# Patient Record
Sex: Male | Born: 1950
Health system: Southern US, Community
[De-identification: ages and names within clinical notes are randomized; demographics above are authoritative.]

## PROBLEM LIST (undated history)

## (undated) DIAGNOSIS — L4050 Arthropathic psoriasis, unspecified: Secondary | ICD-10-CM

## (undated) DIAGNOSIS — G4730 Sleep apnea, unspecified: Secondary | ICD-10-CM

## (undated) DIAGNOSIS — E349 Endocrine disorder, unspecified: Secondary | ICD-10-CM

## (undated) DIAGNOSIS — I10 Essential (primary) hypertension: Secondary | ICD-10-CM

## (undated) DIAGNOSIS — E785 Hyperlipidemia, unspecified: Secondary | ICD-10-CM

## (undated) DIAGNOSIS — N200 Calculus of kidney: Secondary | ICD-10-CM

## (undated) DIAGNOSIS — C4490 Unspecified malignant neoplasm of skin, unspecified: Secondary | ICD-10-CM

## (undated) DIAGNOSIS — Z9889 Other specified postprocedural states: Secondary | ICD-10-CM

## (undated) HISTORY — DX: Sleep apnea, unspecified: G47.30

## (undated) HISTORY — DX: Endocrine disorder, unspecified: E34.9

## (undated) HISTORY — DX: Essential (primary) hypertension: I10

## (undated) HISTORY — DX: Arthropathic psoriasis, unspecified: L40.50

## (undated) HISTORY — DX: Hyperlipidemia, unspecified: E78.5

## (undated) HISTORY — DX: Other specified postprocedural states: Z98.890

## (undated) HISTORY — DX: Unspecified malignant neoplasm of skin, unspecified: C44.90

## (undated) HISTORY — DX: Calculus of kidney: N20.0

---
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
## 1991-04-13 HISTORY — PX: APPENDECTOMY: SHX54

## 2003-03-05 ENCOUNTER — Ambulatory Visit (HOSPITAL_COMMUNITY): Admission: RE | Admit: 2003-03-05 | Discharge: 2003-03-05 | Payer: Self-pay | Admitting: Family Medicine

## 2003-06-20 ENCOUNTER — Emergency Department (HOSPITAL_COMMUNITY): Admission: EM | Admit: 2003-06-20 | Discharge: 2003-06-20 | Payer: Self-pay | Admitting: Emergency Medicine

## 2003-06-20 ENCOUNTER — Emergency Department (HOSPITAL_COMMUNITY): Admission: AD | Admit: 2003-06-20 | Discharge: 2003-06-20 | Payer: Self-pay | Admitting: Family Medicine

## 2004-12-04 ENCOUNTER — Inpatient Hospital Stay (HOSPITAL_COMMUNITY): Admission: EM | Admit: 2004-12-04 | Discharge: 2004-12-04 | Payer: Self-pay | Admitting: Emergency Medicine

## 2004-12-10 ENCOUNTER — Encounter: Admission: RE | Admit: 2004-12-10 | Discharge: 2004-12-10 | Payer: Self-pay

## 2005-07-28 ENCOUNTER — Ambulatory Visit: Payer: Self-pay | Admitting: Internal Medicine

## 2005-08-11 ENCOUNTER — Ambulatory Visit (HOSPITAL_BASED_OUTPATIENT_CLINIC_OR_DEPARTMENT_OTHER): Admission: RE | Admit: 2005-08-11 | Discharge: 2005-08-11 | Payer: Self-pay | Admitting: Internal Medicine

## 2005-08-15 ENCOUNTER — Ambulatory Visit: Payer: Self-pay | Admitting: Internal Medicine

## 2005-08-26 ENCOUNTER — Ambulatory Visit: Payer: Self-pay | Admitting: Internal Medicine

## 2006-04-12 HISTORY — PX: SHOULDER SURGERY: SHX246

## 2006-09-02 ENCOUNTER — Encounter: Payer: Self-pay | Admitting: Internal Medicine

## 2006-10-13 ENCOUNTER — Encounter: Payer: Self-pay | Admitting: Internal Medicine

## 2006-10-18 ENCOUNTER — Encounter: Payer: Self-pay | Admitting: Internal Medicine

## 2006-12-08 ENCOUNTER — Ambulatory Visit: Payer: Self-pay | Admitting: Internal Medicine

## 2006-12-08 DIAGNOSIS — L408 Other psoriasis: Secondary | ICD-10-CM

## 2006-12-08 DIAGNOSIS — E785 Hyperlipidemia, unspecified: Secondary | ICD-10-CM | POA: Insufficient documentation

## 2006-12-08 DIAGNOSIS — E291 Testicular hypofunction: Secondary | ICD-10-CM

## 2006-12-08 DIAGNOSIS — Z85828 Personal history of other malignant neoplasm of skin: Secondary | ICD-10-CM

## 2006-12-08 DIAGNOSIS — E119 Type 2 diabetes mellitus without complications: Secondary | ICD-10-CM

## 2006-12-08 LAB — CONVERTED CEMR LAB: Anti Nuclear Antibody(ANA): NEGATIVE

## 2006-12-13 LAB — CONVERTED CEMR LAB
ALT: 37 units/L (ref 0–53)
AST: 21 units/L (ref 0–37)
Albumin: 4.1 g/dL (ref 3.5–5.2)
Alkaline Phosphatase: 51 units/L (ref 39–117)
Bilirubin, Direct: 0.1 mg/dL (ref 0.0–0.3)
CRP, High Sensitivity: 6 — ABNORMAL HIGH (ref 0.00–5.00)
Folate: 3.5 ng/mL
Rheumatoid fact SerPl-aCnc: <20 intl units/mL — ABNORMAL LOW (ref 0.0–20.0)
Sed Rate: 9 mm/hr (ref 0–20)
TSH: 2.18 microintl units/mL (ref 0.35–5.50)
Total Bilirubin: 1.2 mg/dL (ref 0.3–1.2)
Total CK: 89 units/L (ref 7–195)
Total Protein: 8.2 g/dL (ref 6.0–8.3)
Vitamin B-12: 374 pg/mL (ref 211–911)

## 2006-12-19 ENCOUNTER — Telehealth: Payer: Self-pay | Admitting: Internal Medicine

## 2007-01-09 ENCOUNTER — Encounter: Payer: Self-pay | Admitting: Internal Medicine

## 2007-01-12 ENCOUNTER — Encounter: Payer: Self-pay | Admitting: Internal Medicine

## 2007-01-19 ENCOUNTER — Encounter: Payer: Self-pay | Admitting: Internal Medicine

## 2007-01-30 ENCOUNTER — Encounter: Payer: Self-pay | Admitting: Internal Medicine

## 2007-02-08 ENCOUNTER — Telehealth (INDEPENDENT_AMBULATORY_CARE_PROVIDER_SITE_OTHER): Payer: Self-pay | Admitting: *Deleted

## 2007-02-16 ENCOUNTER — Ambulatory Visit: Payer: Self-pay | Admitting: Internal Medicine

## 2007-03-02 ENCOUNTER — Encounter: Payer: Self-pay | Admitting: Internal Medicine

## 2007-03-10 ENCOUNTER — Encounter: Payer: Self-pay | Admitting: Internal Medicine

## 2007-05-22 ENCOUNTER — Ambulatory Visit: Payer: Self-pay | Admitting: Internal Medicine

## 2007-06-01 ENCOUNTER — Ambulatory Visit: Payer: Self-pay | Admitting: Internal Medicine

## 2007-06-01 LAB — CONVERTED CEMR LAB
Glucose, Urine, Semiquant: >=1000
Ketones, urine, test strip: NEGATIVE
Nitrite: NEGATIVE
Protein, U semiquant: 30
Specific Gravity, Urine: >=1.03
Urobilinogen, UA: NEGATIVE
WBC Urine, dipstick: NEGATIVE
pH: 5

## 2007-06-02 ENCOUNTER — Encounter: Payer: Self-pay | Admitting: Internal Medicine

## 2007-06-02 LAB — CONVERTED CEMR LAB
Bacteria, UA: NONE SEEN
RBC / HPF: NONE SEEN (ref <3)
WBC, UA: NONE SEEN cells/hpf (ref <3)

## 2007-06-07 ENCOUNTER — Telehealth: Payer: Self-pay | Admitting: Internal Medicine

## 2007-06-07 LAB — CONVERTED CEMR LAB
ALT: 34 units/L (ref 0–53)
AST: 26 units/L (ref 0–37)
BUN: 7 mg/dL (ref 6–23)
Basophils Absolute: 0 10*3/uL (ref 0.0–0.1)
Basophils Relative: 0.8 % (ref 0.0–1.0)
CO2: 24 meq/L (ref 19–32)
Calcium: 9.1 mg/dL (ref 8.4–10.5)
Chloride: 98 meq/L (ref 96–112)
Cholesterol: 220 mg/dL (ref 0–200)
Creatinine, Ser: 1.1 mg/dL (ref 0.4–1.5)
Creatinine,U: 368.3 mg/dL
Direct LDL: 143.4 mg/dL
Eosinophils Absolute: 0.1 10*3/uL (ref 0.0–0.6)
Eosinophils Relative: 1.6 % (ref 0.0–5.0)
FSH: 0.5 milliintl units/mL
GFR calc Af Amer: 89 mL/min
GFR calc non Af Amer: 74 mL/min
Glucose, Bld: 243 mg/dL — ABNORMAL HIGH (ref 70–99)
HCT: 56.1 % — ABNORMAL HIGH (ref 39.0–52.0)
HDL: 32.3 mg/dL — ABNORMAL LOW (ref >39.0)
Hemoglobin: 18.4 g/dL — ABNORMAL HIGH (ref 13.0–17.0)
Hgb A1c MFr Bld: 9.9 % — ABNORMAL HIGH (ref 4.6–6.0)
LH: 0.6 milliintl units/mL
Lymphocytes Relative: 33.2 % (ref 12.0–46.0)
MCHC: 32.8 g/dL (ref 30.0–36.0)
MCV: 94 fL (ref 78.0–100.0)
Microalb Creat Ratio: 29.9 mg/g (ref 0.0–30.0)
Microalb, Ur: 11 mg/dL — ABNORMAL HIGH (ref 0.0–1.9)
Monocytes Absolute: 0.5 10*3/uL (ref 0.2–0.7)
Monocytes Relative: 9.2 % (ref 3.0–11.0)
Neutro Abs: 3.2 10*3/uL (ref 1.4–7.7)
Neutrophils Relative %: 55.2 % (ref 43.0–77.0)
PSA: 0.68 ng/mL (ref 0.10–4.00)
Platelets: 224 10*3/uL (ref 150–400)
Potassium: 3.9 meq/L (ref 3.5–5.1)
RBC: 5.97 M/uL — ABNORMAL HIGH (ref 4.22–5.81)
RDW: 13.1 % (ref 11.5–14.6)
Sodium: 134 meq/L — ABNORMAL LOW (ref 135–145)
Testosterone: 724.82 ng/dL (ref 350.00–890)
Total CHOL/HDL Ratio: 6.8
Triglycerides: 237 mg/dL (ref 0–149)
VLDL: 47 mg/dL — ABNORMAL HIGH (ref 0–40)
WBC: 5.7 10*3/uL (ref 4.5–10.5)

## 2007-06-14 ENCOUNTER — Encounter (INDEPENDENT_AMBULATORY_CARE_PROVIDER_SITE_OTHER): Payer: Self-pay | Admitting: *Deleted

## 2007-06-14 ENCOUNTER — Telehealth (INDEPENDENT_AMBULATORY_CARE_PROVIDER_SITE_OTHER): Payer: Self-pay | Admitting: *Deleted

## 2007-06-22 ENCOUNTER — Ambulatory Visit: Payer: Self-pay | Admitting: Endocrinology

## 2007-06-22 DIAGNOSIS — G4730 Sleep apnea, unspecified: Secondary | ICD-10-CM | POA: Insufficient documentation

## 2007-06-23 ENCOUNTER — Encounter: Payer: Self-pay | Admitting: Internal Medicine

## 2007-06-23 ENCOUNTER — Telehealth (INDEPENDENT_AMBULATORY_CARE_PROVIDER_SITE_OTHER): Payer: Self-pay | Admitting: *Deleted

## 2007-07-06 ENCOUNTER — Ambulatory Visit: Payer: Self-pay | Admitting: Internal Medicine

## 2007-07-07 ENCOUNTER — Encounter: Payer: Self-pay | Admitting: Internal Medicine

## 2007-07-07 LAB — CONVERTED CEMR LAB
Bacteria, UA: NONE SEEN
RBC / HPF: NONE SEEN (ref <3)

## 2007-07-14 ENCOUNTER — Telehealth: Payer: Self-pay | Admitting: Internal Medicine

## 2007-07-14 DIAGNOSIS — R5381 Other malaise: Secondary | ICD-10-CM

## 2007-07-14 DIAGNOSIS — R5383 Other fatigue: Secondary | ICD-10-CM

## 2007-07-20 ENCOUNTER — Ambulatory Visit: Payer: Self-pay | Admitting: Internal Medicine

## 2007-07-24 ENCOUNTER — Encounter: Admission: RE | Admit: 2007-07-24 | Discharge: 2007-07-24 | Payer: Self-pay | Admitting: Internal Medicine

## 2007-07-24 ENCOUNTER — Encounter: Payer: Self-pay | Admitting: Internal Medicine

## 2007-07-27 ENCOUNTER — Ambulatory Visit: Payer: Self-pay | Admitting: Internal Medicine

## 2007-07-27 DIAGNOSIS — D518 Other vitamin B12 deficiency anemias: Secondary | ICD-10-CM

## 2007-08-03 ENCOUNTER — Ambulatory Visit: Payer: Self-pay | Admitting: Internal Medicine

## 2007-08-10 ENCOUNTER — Ambulatory Visit: Payer: Self-pay | Admitting: Internal Medicine

## 2007-08-10 ENCOUNTER — Encounter: Payer: Self-pay | Admitting: Endocrinology

## 2007-08-10 LAB — CONVERTED CEMR LAB: Estradiol: 6.9 pg/mL

## 2007-08-11 ENCOUNTER — Encounter: Admission: RE | Admit: 2007-08-11 | Discharge: 2007-08-11 | Payer: Self-pay | Admitting: Rheumatology

## 2007-08-11 ENCOUNTER — Encounter: Payer: Self-pay | Admitting: Internal Medicine

## 2007-08-12 LAB — CONVERTED CEMR LAB
LH: 2.8 milliintl units/mL
Prolactin: 4.7 ng/mL
TSH: 1.46 microintl units/mL (ref 0.35–5.50)
Testosterone: 168.42 ng/dL — ABNORMAL LOW (ref 350.00–890)
Vitamin B-12: 993 pg/mL — ABNORMAL HIGH (ref 211–911)

## 2007-09-13 ENCOUNTER — Telehealth (INDEPENDENT_AMBULATORY_CARE_PROVIDER_SITE_OTHER): Payer: Self-pay | Admitting: *Deleted

## 2007-09-14 ENCOUNTER — Ambulatory Visit: Payer: Self-pay | Admitting: Internal Medicine

## 2007-09-14 LAB — CONVERTED CEMR LAB: Blood Glucose, Fasting: 173 mg/dL

## 2007-09-18 ENCOUNTER — Encounter (INDEPENDENT_AMBULATORY_CARE_PROVIDER_SITE_OTHER): Payer: Self-pay | Admitting: *Deleted

## 2007-09-18 LAB — CONVERTED CEMR LAB
ALT: 34 units/L (ref 0–53)
AST: 27 units/L (ref 0–37)
Cholesterol: 197 mg/dL (ref 0–200)
HDL: 43.6 mg/dL (ref >39.0)
Hgb A1c MFr Bld: 10.3 % — ABNORMAL HIGH (ref 4.6–6.0)
LDL Cholesterol: 124 mg/dL — ABNORMAL HIGH (ref 0–99)
Total CHOL/HDL Ratio: 4.5
Triglycerides: 146 mg/dL (ref 0–149)
VLDL: 29 mg/dL (ref 0–40)

## 2007-09-29 ENCOUNTER — Telehealth: Payer: Self-pay | Admitting: Internal Medicine

## 2007-10-09 ENCOUNTER — Encounter: Payer: Self-pay | Admitting: Internal Medicine

## 2007-10-12 ENCOUNTER — Ambulatory Visit: Payer: Self-pay | Admitting: Internal Medicine

## 2007-11-13 ENCOUNTER — Ambulatory Visit: Payer: Self-pay | Admitting: Internal Medicine

## 2007-12-14 ENCOUNTER — Ambulatory Visit: Payer: Self-pay | Admitting: Internal Medicine

## 2007-12-28 ENCOUNTER — Ambulatory Visit: Payer: Self-pay | Admitting: Internal Medicine

## 2007-12-28 DIAGNOSIS — I10 Essential (primary) hypertension: Secondary | ICD-10-CM

## 2007-12-29 ENCOUNTER — Encounter (INDEPENDENT_AMBULATORY_CARE_PROVIDER_SITE_OTHER): Payer: Self-pay | Admitting: *Deleted

## 2007-12-29 LAB — CONVERTED CEMR LAB
BUN: 16 mg/dL (ref 6–23)
CO2: 24 meq/L (ref 19–32)
Calcium: 9.5 mg/dL (ref 8.4–10.5)
Chloride: 102 meq/L (ref 96–112)
Creatinine, Ser: 0.8 mg/dL (ref 0.4–1.5)
GFR calc Af Amer: 128 mL/min
GFR calc non Af Amer: 106 mL/min
Glucose, Bld: 104 mg/dL — ABNORMAL HIGH (ref 70–99)
Hgb A1c MFr Bld: 6.2 % — ABNORMAL HIGH (ref 4.6–6.0)
Potassium: 4 meq/L (ref 3.5–5.1)
Sed Rate: 14 mm/hr (ref 0–16)
Sodium: 139 meq/L (ref 135–145)

## 2008-01-04 ENCOUNTER — Encounter: Payer: Self-pay | Admitting: Internal Medicine

## 2008-01-30 ENCOUNTER — Encounter: Payer: Self-pay | Admitting: Internal Medicine

## 2008-03-13 ENCOUNTER — Encounter: Payer: Self-pay | Admitting: Internal Medicine

## 2008-03-19 ENCOUNTER — Encounter: Payer: Self-pay | Admitting: Internal Medicine

## 2008-04-02 ENCOUNTER — Ambulatory Visit: Payer: Self-pay | Admitting: Internal Medicine

## 2008-04-02 DIAGNOSIS — N209 Urinary calculus, unspecified: Secondary | ICD-10-CM | POA: Insufficient documentation

## 2008-04-02 LAB — CONVERTED CEMR LAB
Bilirubin Urine: NEGATIVE
Glucose, Urine, Semiquant: NEGATIVE
Ketones, urine, test strip: NEGATIVE
Nitrite: NEGATIVE
Protein, U semiquant: NEGATIVE
Specific Gravity, Urine: >=1.03
Urobilinogen, UA: 0.2
WBC Urine, dipstick: NEGATIVE
pH: 5

## 2008-04-03 ENCOUNTER — Encounter: Payer: Self-pay | Admitting: Internal Medicine

## 2008-04-09 ENCOUNTER — Ambulatory Visit: Payer: Self-pay | Admitting: Cardiovascular Disease

## 2008-04-09 ENCOUNTER — Telehealth: Payer: Self-pay | Admitting: Internal Medicine

## 2008-04-09 ENCOUNTER — Telehealth (INDEPENDENT_AMBULATORY_CARE_PROVIDER_SITE_OTHER): Payer: Self-pay | Admitting: *Deleted

## 2008-04-09 ENCOUNTER — Ambulatory Visit: Payer: Self-pay | Admitting: Internal Medicine

## 2008-04-09 LAB — CONVERTED CEMR LAB
BUN: 17 mg/dL (ref 6–23)
Creatinine, Ser: 1.1 mg/dL (ref 0.4–1.5)

## 2008-04-10 ENCOUNTER — Telehealth: Payer: Self-pay | Admitting: Internal Medicine

## 2008-04-10 ENCOUNTER — Ambulatory Visit: Payer: Self-pay | Admitting: Internal Medicine

## 2008-04-11 ENCOUNTER — Encounter: Payer: Self-pay | Admitting: Internal Medicine

## 2008-04-18 ENCOUNTER — Ambulatory Visit: Payer: Self-pay | Admitting: Internal Medicine

## 2008-04-22 ENCOUNTER — Telehealth (INDEPENDENT_AMBULATORY_CARE_PROVIDER_SITE_OTHER): Payer: Self-pay | Admitting: *Deleted

## 2008-04-22 LAB — CONVERTED CEMR LAB
BUN: 9 mg/dL (ref 6–23)
CO2: 24 meq/L (ref 19–32)
Calcium: 10 mg/dL (ref 8.4–10.5)
Chloride: 100 meq/L (ref 96–112)
Creatinine, Ser: 0.7 mg/dL (ref 0.4–1.5)
GFR calc Af Amer: 149 mL/min
GFR calc non Af Amer: 124 mL/min
Glucose, Bld: 184 mg/dL — ABNORMAL HIGH (ref 70–99)
Potassium: 4 meq/L (ref 3.5–5.1)
Sodium: 137 meq/L (ref 135–145)

## 2008-05-01 ENCOUNTER — Telehealth: Payer: Self-pay | Admitting: Internal Medicine

## 2008-05-03 ENCOUNTER — Ambulatory Visit: Payer: Self-pay | Admitting: Internal Medicine

## 2008-05-06 ENCOUNTER — Telehealth (INDEPENDENT_AMBULATORY_CARE_PROVIDER_SITE_OTHER): Payer: Self-pay | Admitting: *Deleted

## 2008-05-06 LAB — CONVERTED CEMR LAB
BUN: 11 mg/dL (ref 6–23)
Creatinine, Ser: 0.9 mg/dL (ref 0.4–1.5)

## 2008-10-15 ENCOUNTER — Telehealth (INDEPENDENT_AMBULATORY_CARE_PROVIDER_SITE_OTHER): Payer: Self-pay | Admitting: *Deleted

## 2008-10-16 ENCOUNTER — Telehealth (INDEPENDENT_AMBULATORY_CARE_PROVIDER_SITE_OTHER): Payer: Self-pay | Admitting: *Deleted

## 2008-10-18 ENCOUNTER — Encounter: Payer: Self-pay | Admitting: Internal Medicine

## 2008-10-18 ENCOUNTER — Telehealth (INDEPENDENT_AMBULATORY_CARE_PROVIDER_SITE_OTHER): Payer: Self-pay | Admitting: *Deleted

## 2008-10-21 ENCOUNTER — Telehealth (INDEPENDENT_AMBULATORY_CARE_PROVIDER_SITE_OTHER): Payer: Self-pay | Admitting: *Deleted

## 2008-10-21 ENCOUNTER — Encounter: Payer: Self-pay | Admitting: Internal Medicine

## 2008-11-05 ENCOUNTER — Ambulatory Visit: Payer: Self-pay | Admitting: Internal Medicine

## 2008-11-12 LAB — CONVERTED CEMR LAB
ALT: 48 units/L (ref 0–53)
AST: 31 units/L (ref 0–37)
BUN: 17 mg/dL (ref 6–23)
Basophils Absolute: 0 10*3/uL (ref 0.0–0.1)
Basophils Relative: 0.6 % (ref 0.0–3.0)
CO2: 29 meq/L (ref 19–32)
Calcium: 9.3 mg/dL (ref 8.4–10.5)
Chloride: 99 meq/L (ref 96–112)
Cholesterol: 244 mg/dL — ABNORMAL HIGH (ref 0–200)
Creatinine, Ser: 0.9 mg/dL (ref 0.4–1.5)
Direct LDL: 134.5 mg/dL
Eosinophils Absolute: 0.1 10*3/uL (ref 0.0–0.7)
Eosinophils Relative: 2.2 % (ref 0.0–5.0)
GFR calc non Af Amer: 92.1 mL/min (ref >60)
Glucose, Bld: 269 mg/dL — ABNORMAL HIGH (ref 70–99)
HCT: 44 % (ref 39.0–52.0)
HDL: 42.5 mg/dL (ref >39.00)
Hemoglobin: 15.4 g/dL (ref 13.0–17.0)
Hgb A1c MFr Bld: 8.9 % — ABNORMAL HIGH (ref 4.6–6.5)
Lymphocytes Relative: 33.3 % (ref 12.0–46.0)
Lymphs Abs: 1.8 10*3/uL (ref 0.7–4.0)
MCHC: 35 g/dL (ref 30.0–36.0)
MCV: 93.3 fL (ref 78.0–100.0)
Monocytes Absolute: 0.5 10*3/uL (ref 0.1–1.0)
Monocytes Relative: 8.6 % (ref 3.0–12.0)
Neutro Abs: 2.9 10*3/uL (ref 1.4–7.7)
Neutrophils Relative %: 55.3 % (ref 43.0–77.0)
PSA: 0.4 ng/mL (ref 0.10–4.00)
Platelets: 205 10*3/uL (ref 150.0–400.0)
Potassium: 4.6 meq/L (ref 3.5–5.1)
RBC: 4.71 M/uL (ref 4.22–5.81)
RDW: 12.6 % (ref 11.5–14.6)
Sodium: 137 meq/L (ref 135–145)
TSH: 1.32 microintl units/mL (ref 0.35–5.50)
Total CHOL/HDL Ratio: 6
Triglycerides: 350 mg/dL — ABNORMAL HIGH (ref 0.0–149.0)
Uric Acid, Serum: 4.9 mg/dL (ref 4.0–7.8)
VLDL: 70 mg/dL — ABNORMAL HIGH (ref 0.0–40.0)
WBC: 5.3 10*3/uL (ref 4.5–10.5)

## 2008-11-13 ENCOUNTER — Encounter (INDEPENDENT_AMBULATORY_CARE_PROVIDER_SITE_OTHER): Payer: Self-pay | Admitting: *Deleted

## 2008-11-13 ENCOUNTER — Ambulatory Visit: Payer: Self-pay | Admitting: Internal Medicine

## 2008-12-09 ENCOUNTER — Ambulatory Visit: Payer: Self-pay | Admitting: Internal Medicine

## 2008-12-10 ENCOUNTER — Encounter: Payer: Self-pay | Admitting: Internal Medicine

## 2008-12-11 ENCOUNTER — Encounter (INDEPENDENT_AMBULATORY_CARE_PROVIDER_SITE_OTHER): Payer: Self-pay | Admitting: *Deleted

## 2008-12-11 LAB — CONVERTED CEMR LAB: Fecal Occult Bld: NEGATIVE

## 2008-12-13 ENCOUNTER — Encounter: Payer: Self-pay | Admitting: Internal Medicine

## 2008-12-17 ENCOUNTER — Encounter: Payer: Self-pay | Admitting: Internal Medicine

## 2008-12-19 ENCOUNTER — Telehealth: Payer: Self-pay | Admitting: Internal Medicine

## 2008-12-31 ENCOUNTER — Encounter: Admission: RE | Admit: 2008-12-31 | Discharge: 2008-12-31 | Payer: Self-pay | Admitting: Internal Medicine

## 2008-12-31 ENCOUNTER — Encounter: Payer: Self-pay | Admitting: Internal Medicine

## 2009-01-14 ENCOUNTER — Ambulatory Visit: Payer: Self-pay | Admitting: Internal Medicine

## 2009-01-16 LAB — CONVERTED CEMR LAB: Hgb A1c MFr Bld: 7.6 % — ABNORMAL HIGH (ref 4.6–6.5)

## 2009-03-12 ENCOUNTER — Encounter: Payer: Self-pay | Admitting: Internal Medicine

## 2009-04-14 ENCOUNTER — Ambulatory Visit: Payer: Self-pay | Admitting: Internal Medicine

## 2009-04-15 LAB — CONVERTED CEMR LAB: Hgb A1c MFr Bld: 6.6 % — ABNORMAL HIGH (ref 4.6–6.5)

## 2009-04-17 LAB — CONVERTED CEMR LAB: Vit D, 25-Hydroxy: 27 ng/mL — ABNORMAL LOW (ref 30–89)

## 2009-04-21 ENCOUNTER — Ambulatory Visit: Payer: Self-pay | Admitting: Internal Medicine

## 2009-04-23 ENCOUNTER — Encounter (INDEPENDENT_AMBULATORY_CARE_PROVIDER_SITE_OTHER): Payer: Self-pay | Admitting: *Deleted

## 2009-04-29 ENCOUNTER — Telehealth (INDEPENDENT_AMBULATORY_CARE_PROVIDER_SITE_OTHER): Payer: Self-pay | Admitting: *Deleted

## 2009-05-12 ENCOUNTER — Telehealth: Payer: Self-pay | Admitting: Internal Medicine

## 2009-05-12 ENCOUNTER — Encounter: Payer: Self-pay | Admitting: Endocrinology

## 2009-05-15 ENCOUNTER — Telehealth: Payer: Self-pay | Admitting: Internal Medicine

## 2009-05-27 ENCOUNTER — Telehealth (INDEPENDENT_AMBULATORY_CARE_PROVIDER_SITE_OTHER): Payer: Self-pay | Admitting: *Deleted

## 2009-05-27 LAB — CONVERTED CEMR LAB
ALT: 62 units/L — ABNORMAL HIGH (ref 0–53)
AST: 37 units/L (ref 0–37)
Albumin: 4.1 g/dL (ref 3.5–5.2)
Alkaline Phosphatase: 43 units/L (ref 39–117)
BUN: 11 mg/dL (ref 6–23)
Basophils Absolute: 0.1 10*3/uL (ref 0.0–0.1)
Basophils Relative: 0.9 % (ref 0.0–3.0)
Bilirubin, Direct: 0 mg/dL (ref 0.0–0.3)
CO2: 26 meq/L (ref 19–32)
Calcium: 10.1 mg/dL (ref 8.4–10.5)
Chloride: 104 meq/L (ref 96–112)
Creatinine, Ser: 0.9 mg/dL (ref 0.4–1.5)
Eosinophils Absolute: 0.1 10*3/uL (ref 0.0–0.7)
Eosinophils Relative: 2.3 % (ref 0.0–5.0)
GFR calc non Af Amer: 91.95 mL/min (ref >60)
Glucose, Bld: 134 mg/dL — ABNORMAL HIGH (ref 70–99)
HCT: 43.5 % (ref 39.0–52.0)
Hemoglobin: 14.3 g/dL (ref 13.0–17.0)
Lymphocytes Relative: 26.2 % (ref 12.0–46.0)
Lymphs Abs: 1.5 10*3/uL (ref 0.7–4.0)
MCHC: 32.8 g/dL (ref 30.0–36.0)
MCV: 97.9 fL (ref 78.0–100.0)
Monocytes Absolute: 0.5 10*3/uL (ref 0.1–1.0)
Monocytes Relative: 8 % (ref 3.0–12.0)
Neutro Abs: 3.7 10*3/uL (ref 1.4–7.7)
Neutrophils Relative %: 62.6 % (ref 43.0–77.0)
Platelets: 264 10*3/uL (ref 150.0–400.0)
Potassium: 4.1 meq/L (ref 3.5–5.1)
RBC: 4.45 M/uL (ref 4.22–5.81)
RDW: 13.7 % (ref 11.5–14.6)
Sodium: 139 meq/L (ref 135–145)
Total Bilirubin: 0.7 mg/dL (ref 0.3–1.2)
Total Protein: 8.2 g/dL (ref 6.0–8.3)
WBC: 5.9 10*3/uL (ref 4.5–10.5)

## 2009-05-30 ENCOUNTER — Ambulatory Visit: Payer: Self-pay | Admitting: Internal Medicine

## 2009-06-09 LAB — CONVERTED CEMR LAB
ALT: 60 units/L — ABNORMAL HIGH (ref 0–53)
AST: 34 units/L (ref 0–37)
Albumin: 4.1 g/dL (ref 3.5–5.2)
Alkaline Phosphatase: 47 units/L (ref 39–117)
BUN: 19 mg/dL (ref 6–23)
Bilirubin, Direct: 0.1 mg/dL (ref 0.0–0.3)
CO2: 25 meq/L (ref 19–32)
Calcium: 9.6 mg/dL (ref 8.4–10.5)
Chloride: 104 meq/L (ref 96–112)
Cholesterol: 169 mg/dL (ref 0–200)
Creatinine, Ser: 0.8 mg/dL (ref 0.4–1.5)
Creatinine,U: 159.4 mg/dL
Direct LDL: 92.9 mg/dL
GFR calc non Af Amer: 105.3 mL/min (ref >60)
Glucose, Bld: 148 mg/dL — ABNORMAL HIGH (ref 70–99)
HDL: 46.2 mg/dL (ref >39.00)
Hgb A1c MFr Bld: 6.8 % — ABNORMAL HIGH (ref 4.6–6.5)
Microalb Creat Ratio: 10 mg/g (ref 0.0–30.0)
Microalb, Ur: 1.6 mg/dL (ref 0.0–1.9)
Potassium: 4.2 meq/L (ref 3.5–5.1)
Sodium: 138 meq/L (ref 135–145)
Total Bilirubin: 0.5 mg/dL (ref 0.3–1.2)
Total CHOL/HDL Ratio: 4
Total Protein: 7.9 g/dL (ref 6.0–8.3)
Triglycerides: 273 mg/dL — ABNORMAL HIGH (ref 0.0–149.0)
VLDL: 54.6 mg/dL — ABNORMAL HIGH (ref 0.0–40.0)

## 2009-06-16 ENCOUNTER — Telehealth: Payer: Self-pay | Admitting: Internal Medicine

## 2009-06-17 ENCOUNTER — Encounter: Payer: Self-pay | Admitting: Internal Medicine

## 2009-07-03 ENCOUNTER — Telehealth (INDEPENDENT_AMBULATORY_CARE_PROVIDER_SITE_OTHER): Payer: Self-pay | Admitting: *Deleted

## 2009-07-06 IMAGING — CT CT PELVIS W/ CM
2 of 9 series · 13 of 46 positions shown, 19 images · IV contrast (agent unspecified)
Comparison: None

CT ABDOMEN

CLINICAL DATA: Right flank, abdominal, and pelvic pain.
Hematuria.

CT ABDOMEN WITHOUT AND WITH CONTRAST
CT PELVIS WITH CONTRAST
TECHNIQUE: Multidetector CT imaging of the abdomen was performed
initially following the standard protocol before administration of
intravenous contrast.  Multidetector CT imaging of the abdomen and
pelvis was then performed following the standard protocol during
the bolus injection of intravenous contrast.
Contrast: 125 ml intravenous Imnipaque-111.

[Series 3: abd_pel_(id) 5.0 b30f st · axial · 0.93mm/px · z∈[-501,-81]mm · 10 of 102 slices shown, 16 images]
[im 9/102  soft-tissue]
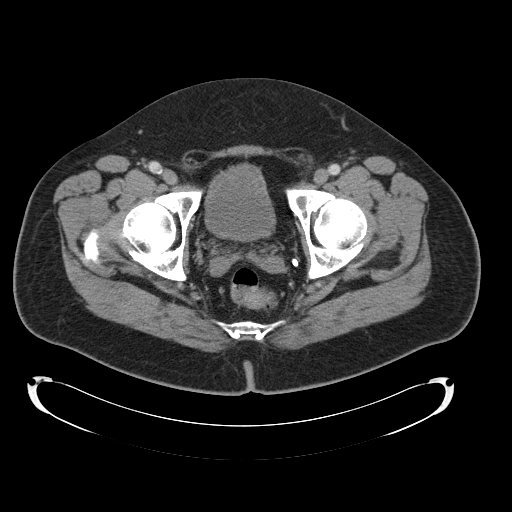
[im 9/102  bone]
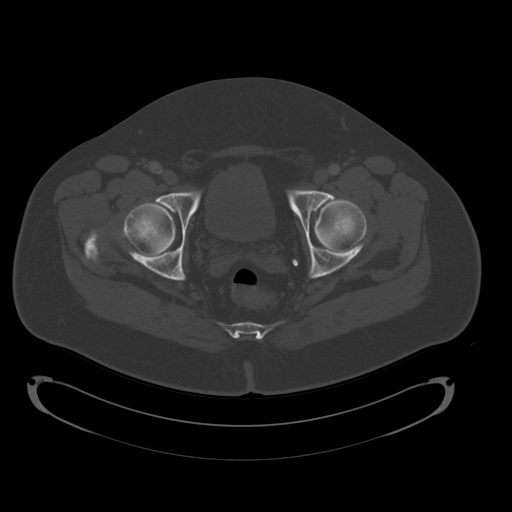
[im 17/102  soft-tissue]
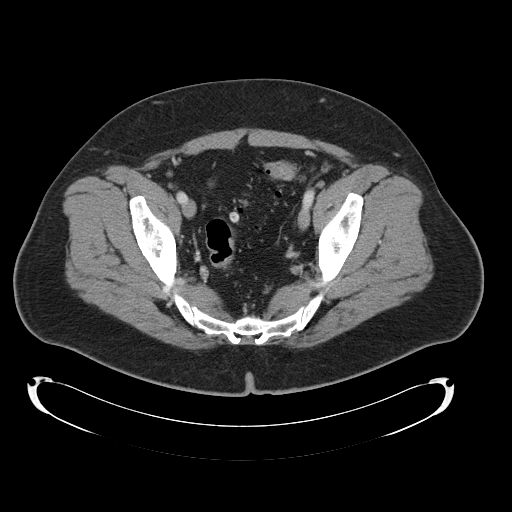
[im 26/102  soft-tissue]
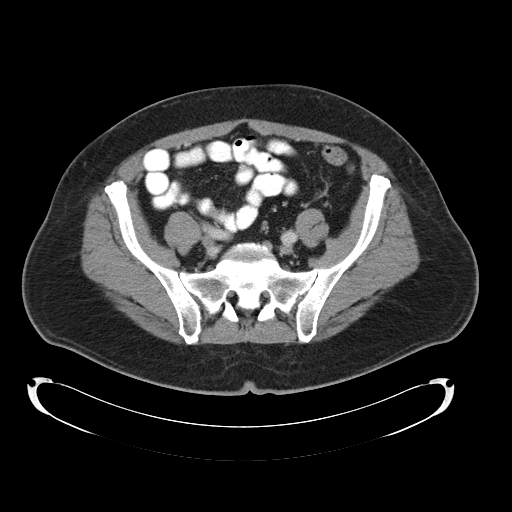
[im 34/102  soft-tissue]
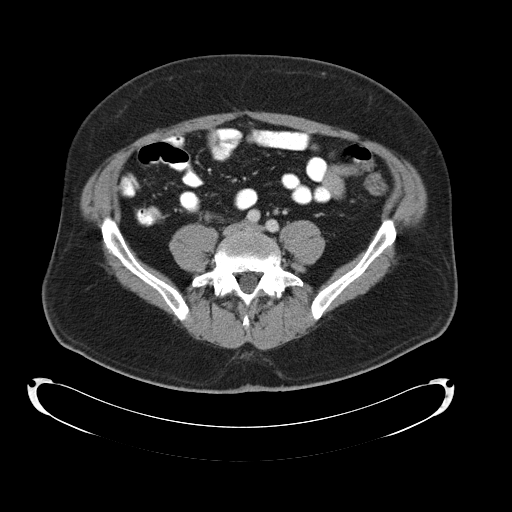
[im 43/102  soft-tissue]
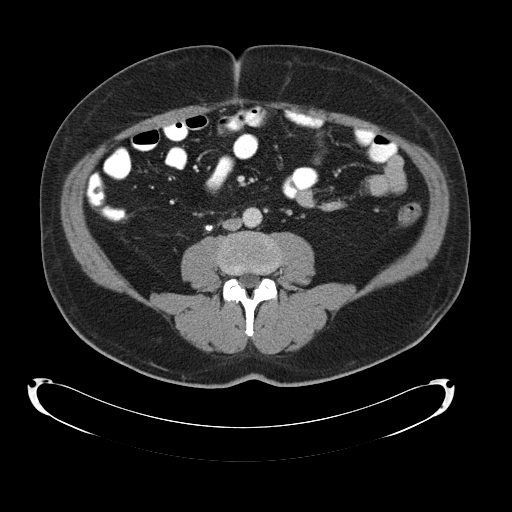
[im 59/102  soft-tissue]
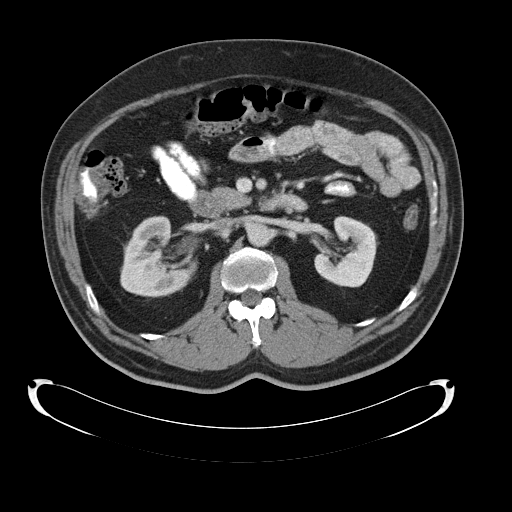
[im 68/102  soft-tissue]
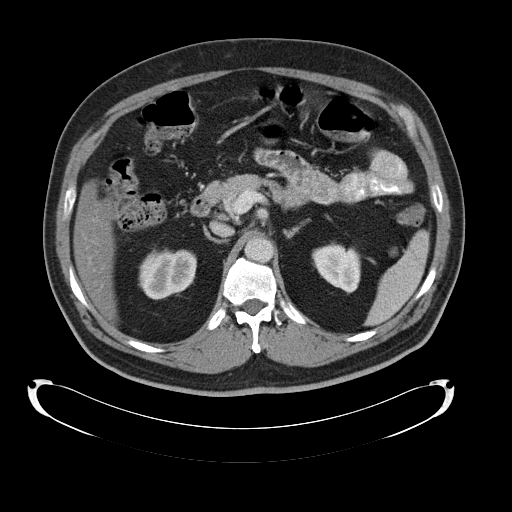
[im 68/102  lung]
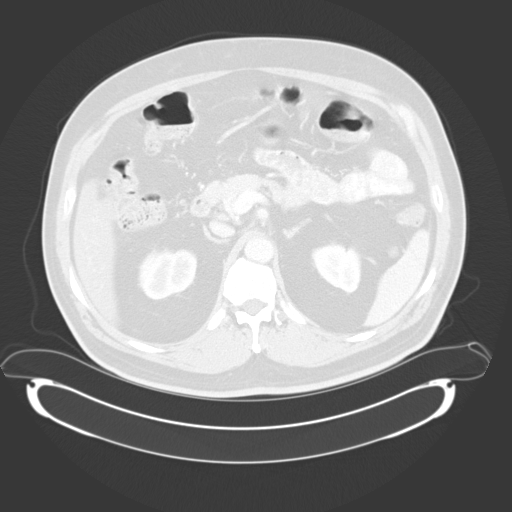
[im 76/102  soft-tissue]
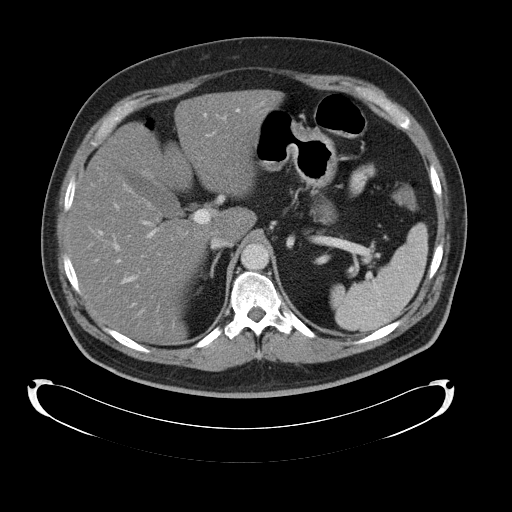
[im 76/102  lung]
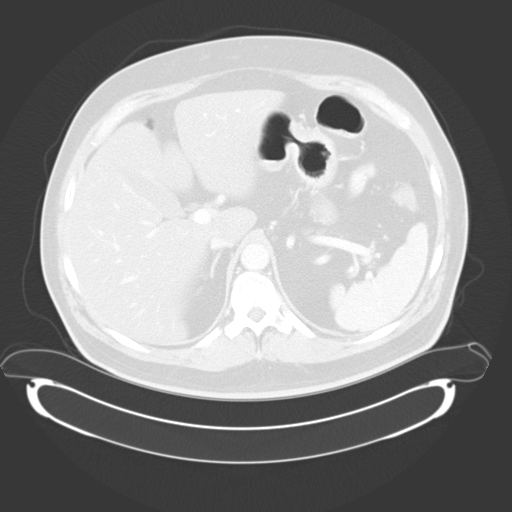
[im 85/102  soft-tissue]
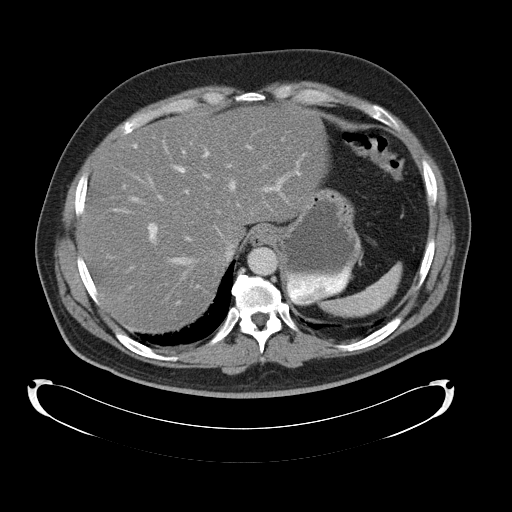
[im 85/102  lung]
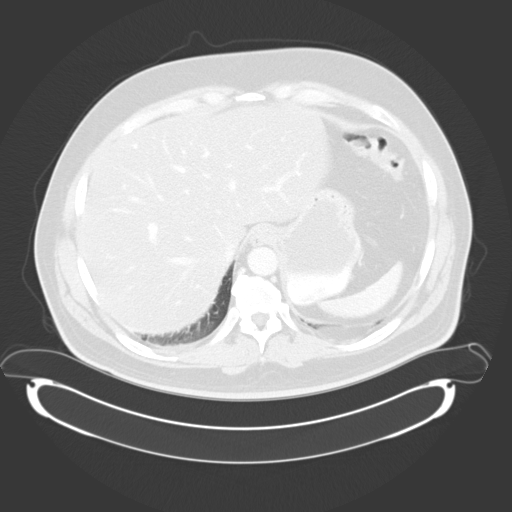
[im 85/102  bone]
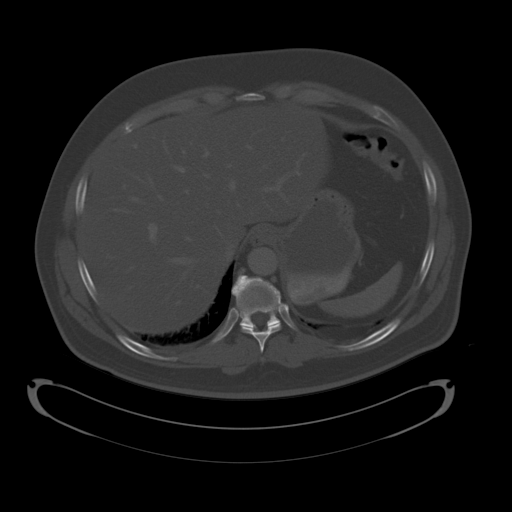
[im 93/102  soft-tissue]
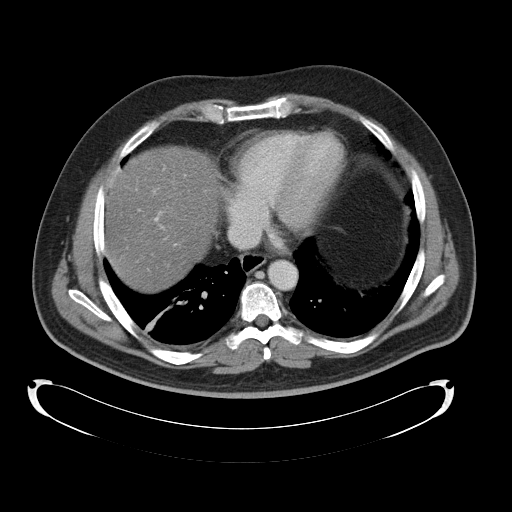
[im 93/102  lung]
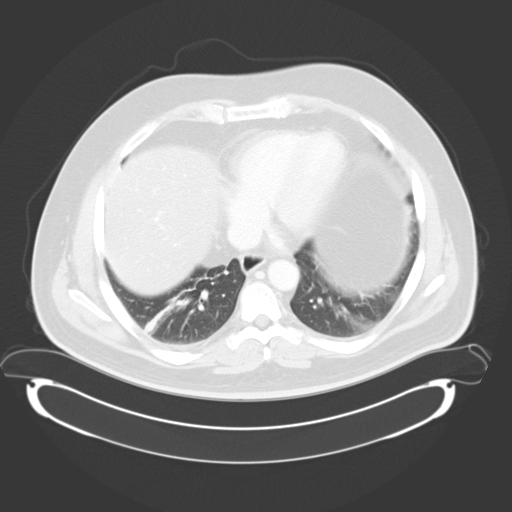

[Series 602: <mpr range> · coronal · 0.93mm/px · 3 of 156 slices shown]
[im 32/156  soft-tissue]
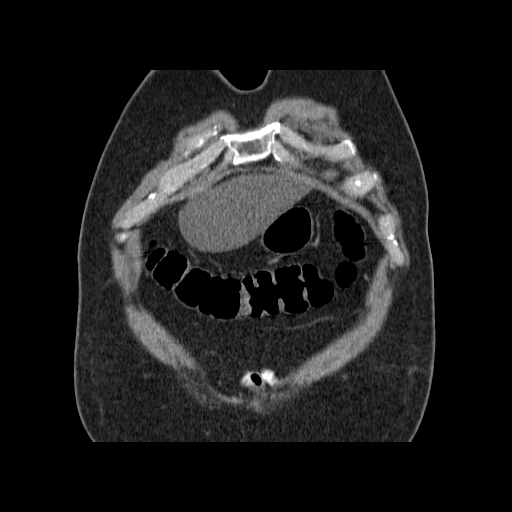
[im 63/156  soft-tissue]
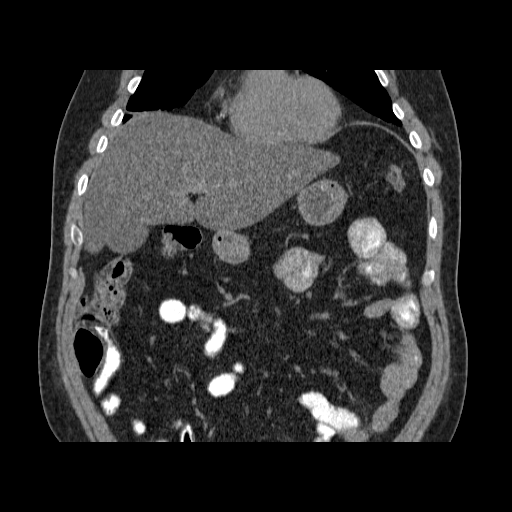
[im 94/156  soft-tissue]
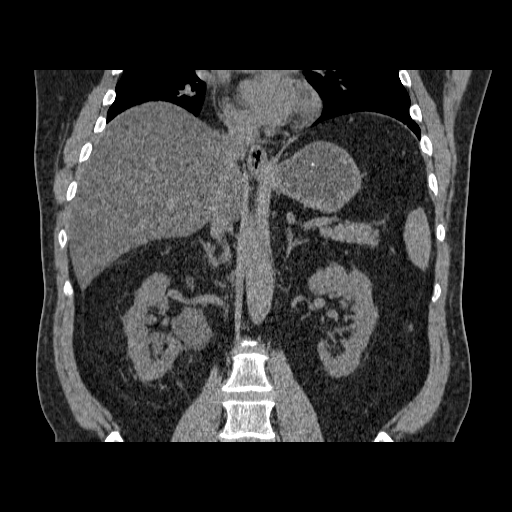

[13 of 46 positions shown; findings below may reference images not displayed]

FINDINGS: Mild bibasilar atelectasis is noted.

Moderate to severe diffuse fatty infiltration of the liver is
present.
The spleen, adrenal glands, pancreas, and gallbladder are
unremarkable.

There is mild to moderate right hydronephrosis caused by two
adjacent proximal right ureteral calculi measuring 3 mm and 5 mm
(image 60, series 3 and image 88, series 602).
Punctate nonobstructing bilateral renal calculi are also
identified.

No free fluid, enlarged lymph nodes, biliary dilation or abdominal
aortic aneurysm identified.
The visualized bowel is unremarkable.
No acute bony abnormalities are identified.
IMPRESSION: Two separate adjacent proximal right ureteral calculi (3 mm and 5
mm) causing mild to moderate right hydronephrosis.

Punctate nonobstructing bilateral renal calculi.

Moderate to severe diffuse fatty infiltration of the liver.

CT PELVIS
FINDINGS: Colonic diverticulosis is identified without evidence of
diverticulitis.
The remainder of the visualized bowel is unremarkable.
There is no evidence of free fluid, enlarged lymph nodes, or
urinary calculi within the pelvis.
No acute bony abnormalities are identified.
IMPRESSION: No evidence of acute abnormality within the pelvis.

Colonic diverticulosis.

## 2009-07-09 ENCOUNTER — Encounter: Payer: Self-pay | Admitting: Internal Medicine

## 2009-07-11 ENCOUNTER — Telehealth: Payer: Self-pay | Admitting: Internal Medicine

## 2009-07-14 ENCOUNTER — Telehealth: Payer: Self-pay | Admitting: Internal Medicine

## 2009-07-17 ENCOUNTER — Ambulatory Visit: Payer: Self-pay | Admitting: Internal Medicine

## 2009-07-22 LAB — CONVERTED CEMR LAB
ALT: 65 units/L — ABNORMAL HIGH (ref 0–53)
AST: 37 units/L (ref 0–37)
Albumin: 4.3 g/dL (ref 3.5–5.2)
Alkaline Phosphatase: 39 units/L (ref 39–117)
BUN: 12 mg/dL (ref 6–23)
Bilirubin, Direct: 0 mg/dL (ref 0.0–0.3)
CO2: 28 meq/L (ref 19–32)
Calcium: 9.3 mg/dL (ref 8.4–10.5)
Chloride: 104 meq/L (ref 96–112)
Creatinine, Ser: 0.9 mg/dL (ref 0.4–1.5)
GFR calc non Af Amer: 91.87 mL/min (ref >60)
Glucose, Bld: 170 mg/dL — ABNORMAL HIGH (ref 70–99)
PSA: 0.61 ng/mL (ref 0.10–4.00)
Potassium: 4 meq/L (ref 3.5–5.1)
Sodium: 141 meq/L (ref 135–145)
Total Bilirubin: 0.5 mg/dL (ref 0.3–1.2)
Total Protein: 8 g/dL (ref 6.0–8.3)

## 2009-09-01 ENCOUNTER — Encounter: Payer: Self-pay | Admitting: Internal Medicine

## 2009-09-30 ENCOUNTER — Ambulatory Visit: Payer: Self-pay | Admitting: Internal Medicine

## 2009-10-05 LAB — CONVERTED CEMR LAB
ALT: 48 units/L (ref 0–53)
AST: 38 units/L — ABNORMAL HIGH (ref 0–37)
Albumin: 4.8 g/dL (ref 3.5–5.2)
Alkaline Phosphatase: 42 units/L (ref 39–117)
Basophils Absolute: 0 10*3/uL (ref 0.0–0.1)
Basophils Relative: 0.6 % (ref 0.0–3.0)
Bilirubin, Direct: 0.2 mg/dL (ref 0.0–0.3)
Cholesterol: 186 mg/dL (ref 0–200)
Direct LDL: 114 mg/dL
Eosinophils Absolute: 0.1 10*3/uL (ref 0.0–0.7)
Eosinophils Relative: 0.9 % (ref 0.0–5.0)
HCT: 53.3 % — ABNORMAL HIGH (ref 39.0–52.0)
HDL: 40.1 mg/dL (ref >39.00)
Hemoglobin: 18.2 g/dL — ABNORMAL HIGH (ref 13.0–17.0)
Hgb A1c MFr Bld: 6.9 % — ABNORMAL HIGH (ref 4.6–6.5)
Lymphocytes Relative: 20 % (ref 12.0–46.0)
Lymphs Abs: 1.6 10*3/uL (ref 0.7–4.0)
MCHC: 34.1 g/dL (ref 30.0–36.0)
MCV: 96.8 fL (ref 78.0–100.0)
Monocytes Absolute: 0.7 10*3/uL (ref 0.1–1.0)
Monocytes Relative: 8.5 % (ref 3.0–12.0)
Neutro Abs: 5.6 10*3/uL (ref 1.4–7.7)
Neutrophils Relative %: 70 % (ref 43.0–77.0)
Platelets: 213 10*3/uL (ref 150.0–400.0)
RBC: 5.51 M/uL (ref 4.22–5.81)
RDW: 13 % (ref 11.5–14.6)
Total Bilirubin: 0.7 mg/dL (ref 0.3–1.2)
Total CHOL/HDL Ratio: 5
Total Protein: 7.8 g/dL (ref 6.0–8.3)
Triglycerides: 291 mg/dL — ABNORMAL HIGH (ref 0.0–149.0)
VLDL: 58.2 mg/dL — ABNORMAL HIGH (ref 0.0–40.0)
WBC: 8.1 10*3/uL (ref 4.5–10.5)

## 2009-10-07 ENCOUNTER — Ambulatory Visit: Payer: Self-pay | Admitting: Internal Medicine

## 2010-05-03 ENCOUNTER — Encounter: Payer: Self-pay | Admitting: Internal Medicine

## 2010-05-10 LAB — CONVERTED CEMR LAB
Bilirubin Urine: NEGATIVE
Glucose, Urine, Semiquant: >=1000
Ketones, urine, test strip: NEGATIVE
Nitrite: NEGATIVE
PSA: 0.51 ng/mL (ref 0.10–4.00)
Protein, U semiquant: NEGATIVE
Specific Gravity, Urine: 1.025
Urobilinogen, UA: NEGATIVE
WBC Urine, dipstick: NEGATIVE
pH: 5

## 2010-05-14 NOTE — Assessment & Plan Note (Signed)
Summary: Samuel Meyer   Vital Signs:  Patient profile:   60 year old male Weight:      264 pounds Pulse rate:   70 / minute Pulse rhythm:   regular BP sitting:   122 / 76  (left arm) Cuff size:   large  Vitals Entered By: Army Fossa CMA (October 07, 2009 10:11 AM) CC: Pt here to follow up on DM   History of Present Illness: ROV  AODM-- check ambulatory CBGs rarely , good medication compliance   arthritis: sees MD at [facility] 12-2009, on embrel    TESTOSTERONE DEFICIENCY--sees Dr Lurene Shadow now, to see her next month  HYPERLIPIDEMIA  -- good medication compliance , diet good   Hypertension-- ambulatory BPs in the 120s/80s   Allergies (verified): No Known Drug Allergies  Past History:  Past Medical History: AODM dx 2-09 aprox h/o SLEEP APNEA  psoriatic  arthritis -- seen at [facility] . Started on methotrexate 03/12/2009, switch to enbrel aprox 2-11 CARDIAC CATHETERIZATION, LEFT--remotely SKIN CANCER, HX OF  TESTOSTERONE DEFICIENCY--sees Dr Horald Pollen HYPERLIPIDEMIA   Hypertension  Past Surgical History: Reviewed history from 12/08/2006 and no changes required. Appendectomy (1993) Tonsillectomy (1960)  Social History: Reviewed history from 11/13/2008 and no changes required. 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 Resp:  Denies cough and shortness of breath. GI:  Denies bloody stools, nausea, and vomiting. Endo:  some  warm feeling in his feet from time to  time .  Physical Exam  General:  alert, well-developed, and well-nourished.   Lungs:  normal respiratory effort, no intercostal retractions, no accessory muscle use, and normal breath sounds.   Heart:  normal rate, regular rhythm, no murmur, and no gallop.   Pulses:  normal pedal pulses bilaterally    Extremities:  no pretibial edema bilaterally  Psych:  Cognition and judgment appear intact. Alert and cooperative with normal attention span and concentration.  not anxious appearing and not depressed appearing.    Diabetes Management Exam:    Foot Exam (with socks and/or shoes not present):       Sensory-Pinprick/Light touch:          Left medial foot (L-4): normal          Left dorsal foot (L-5): normal          Left lateral foot (S-1): normal          Right medial foot (L-4): normal          Right dorsal foot (L-5): normal          Right lateral foot (S-1): normal       Sensory-Monofilament:          Left foot: normal          Right foot: normal       Inspection:          Left foot: normal          Right foot: normal       Nails:          Left foot: normal          Right foot: normal   Impression & Recommendations:  Problem # 1:  HYPERTENSION (ICD-401.9) well-controlled His updated medication list for this problem includes:  Benicar Hct 40-25 Mg Tabs (Olmesartan medoxomil-hctz) .Marland Kitchen... 1 by mouth qd  BP today: 122/76 Prior BP: 110/70 (04/21/2009)  Labs Reviewed: K+: 4.0 (07/17/2009) Creat: : 0.9 (07/17/2009)   Chol: 186 (09/30/2009)   HDL: 40.10 (09/30/2009)   LDL: 124 (09/14/2007)   TG: 291.0 (09/30/2009)  Problem # 2:  AODM (ICD-250.00) all recent labs reviewed with the patient A1c 6.9 Stable, no change He does have some painful peripheral neuropathy ("occasionally burns in the  feet") No sensory decrease neuropathy feet care discussed His updated medication list for this problem includes:    Benicar Hct 40-25 Mg Tabs (Olmesartan medoxomil-hctz) .Marland Kitchen... 1 by mouth qd    Metformin Hcl 1000 Mg Tabs (Metformin hcl) .Marland Kitchen... As directed    Glimepiride 4 Mg Tabs (Glimepiride) .Marland Kitchen... 1/2 by mouth once daily  Labs Reviewed: Creat: 0.9 (07/17/2009)    Reviewed HgBA1c results: 6.9 (09/30/2009)  6.8 (05/30/2009)  Problem # 3:  HYPERLIPIDEMIA (ICD-272.4) patient will not  take statins Results discussed, triglycerides  and LDL slightly high Diet and exercise discussed His updated medication list for this problem includes:    Lovaza 1 Gm Caps (Omega-3-acid ethyl esters) .Marland Kitchen... 2 by mouth bid  Labs Reviewed: SGOT: 38 (09/30/2009)   SGPT: 48 (09/30/2009)   HDL:40.10 (09/30/2009), 46.20 (05/30/2009)  LDL:124 (09/14/2007), DEL (06/01/2007)  Chol:186 (09/30/2009), 169 (05/30/2009)  Trig:291.0 (09/30/2009), 273.0 (05/30/2009)  Problem # 4:  PSORIASIS (ICD-696.1) tolerates enbrel for psoriatic arthritis well  Complete Medication List: 1)  Benicar Hct 40-25 Mg Tabs (Olmesartan medoxomil-hctz) .Marland Kitchen.. 1 by mouth qd 2)  Lovaza 1 Gm Caps (Omega-3-acid ethyl esters) .... 2 by mouth bid 3)  Potassium Chloride Cr 10 Meq Tbcr (Potassium chloride) .... 2 by mouth bid 4)  Metformin Hcl 1000 Mg Tabs (Metformin hcl) .... As directed 5)  Glimepiride 4 Mg Tabs (Glimepiride) .... 1/2 by mouth once daily 6)  Mag Glycinate 200mg   .... Bid 7)  Lecithin 1200mg   .... Bid 8)  Flax Oil 1000mg   .... Bid 9)  Zinc 30mg   10)  Deha 125mg   11)  Cyanocobalamin 1000 Mcg/ml Soln (Cyanocobalamin) .Marland Kitchen.. 1 ml qmo 12)  Freestyle Freedom Lite Test Strips & Lancets  .... Check blood sugar three times a day 13)  Enbrel 50 Mg/ml Soln (Etanercept) .... Qwk  Patient Instructions: 1)  Please schedule a follow-up appointment in 4 months .  Prescriptions: GLIMEPIRIDE 4 MG TABS (GLIMEPIRIDE) 1/2 by mouth once daily  #60 x 3   Entered and Authorized by:   Nolon Rod. Paz MD   Signed by:   Nolon Rod. Paz MD on 10/07/2009   Method used:   [facility]  [address] [id number]       [address]       [place], [place]  [address]       Ph: [phone] or [phone]       Fax: [phone]   RxID:   [id number] METFORMIN HCL 1000 MG TABS (METFORMIN HCL) as directed  #180 Tablet x 3   Entered and Authorized by:   Nolon Rod. Paz MD   Signed by:   Nolon Rod. Paz MD on  10/07/2009   Method used:   [facility]        [facility]  [address] [id number]       [address]       [facility]  [place], [place]  [address]       Ph: [phone] or [phone]       Fax: [phone]   RxID:   [id number] POTASSIUM CHLORIDE CR 10 MEQ  TBCR (POTASSIUM CHLORIDE) 2 by mouth bid  #360 x 3   Entered and Authorized by:   Nolon Rod. Paz MD   Signed by:   Nolon Rod. Paz MD on 10/07/2009   Method used:   [facility]        [facility]  [address] [id number]       [address]       [place], [place]  [address]       Ph: [phone] or [phone]       Fax: [phone]   RxID:   [id number] LOVAZA 1 GM  CAPS (OMEGA-3-ACID ETHYL ESTERS) 2 by mouth bid  #360 x 3   Entered and Authorized by:   Nolon Rod. Paz MD   Signed by:   Nolon Rod. Paz MD on 10/07/2009   Method used:   [facility]        [facility]  [address] [id number]       [address]       [place], [place]  [address]       Ph: [phone] or [phone]       Fax: [phone]   RxID:   [id number] BENICAR HCT 40-25 MG  TABS (OLMESARTAN MEDOXOMIL-HCTZ) 1 by mouth qd  #90 x 3   Entered and Authorized by:   Nolon Rod. Paz MD   Signed by:   Nolon Rod. Paz MD on 10/07/2009   Method used:   [facility]        [facility]  [address] [id number]       [address]       [place], [place]  [address]       Ph: [phone] or [phone]       Fax: [phone]   RxID:   [id number]

## 2010-05-14 NOTE — Progress Notes (Signed)
Summary: referral to endo  Phone Note Call from Patient   Summary of Call: PATIENT LEFT MESSAGE ON VM: Patient went to see Dr. Everardo All, waited for >1hour.  Left without being seen.  Patient states if you want him to see endocrinology needs referral to someone else besides Caleb Popp  May 12, 2009 4:57 PM   Follow-up for Phone Call        try Dr Lucianne Muss, or Dr Emmaline Life E. Paz MD  May 12, 2009 5:05 PM

## 2010-05-14 NOTE — Assessment & Plan Note (Signed)
Summary: new / testosterone def / bcbs / paz / # / cd   Referring Provider:  paz   History of Present Illness: (left without being seen)  Allergies: No Known Drug Allergies   Other Orders: No Charge Patient Arrived (NCPA0) (NCPA0)

## 2010-05-14 NOTE — Progress Notes (Signed)
  Phone Note Call from Patient Call back at Home Phone [phone]   Summary of Call: Patient states he really wants a PSA test.  He will pay out of pocket for it.  States "I just feel better knowing that it is ok" Shary Decamp  July 14, 2009 3:36 PM ok [place] E. Aalyssa Elderkin MD  July 14, 2009 7:07 PM

## 2010-05-14 NOTE — Progress Notes (Signed)
Summary: [facility] 1/20  Phone Note Refill Request Message from:  Fax from Pharmacy [facility] [address] fax [phone]  Refills Requested: Medication #1:  BENICAR HCT 40-25 MG  TABS 1 by mouth qd pt  has requested that we contact you to obtain a lower cost alternative for the current med. losartan-hydrochlorothiazide,  Initial call taken by: Barb Merino,  April 29, 2009 8:53 AM  Follow-up for Phone Call        if the patient is willing to try and if  he has no history of problems with ACEi, then switch to lisinopril HCT 20-12.5: 2 tabs q AM nurse visits for BP check and BMP in 4 weeks watch for side effects such as cough Follow-up by: Nolon Rod. Paz MD,  April 30, 2009 10:04 AM  Additional Follow-up for Phone Call Additional follow up Details #1::        Caller: [employer] Summary of Call: PRIOR AUTH APPROVED FROM 10/22/08 TO 07/18/2011-[facility]AR [facility] Initial call taken by: Kandice Hams,  October 21, 2008 8:37 AM   See above note, Benicar was approved by [facility] until 07/2011.  LMOM for pt to return call...Marland KitchenMarland Kitchenis patient requesting change or pharmacy Shary Decamp  May 01, 2009 11:56 AM     Additional Follow-up for Phone Call Additional follow up Details #2::    DISCUSSED WITH PHARMACY Follow-up by: Shary Decamp,  May 02, 2009 12:12 PM

## 2010-05-14 NOTE — Progress Notes (Signed)
Summary: lab order  Phone Note Call from Patient   Summary of Call: Patient is scheduled for labs on 4/26, ov with Keiara Sneeringer 5/3.  Needs lab order.  We just did labs this month.  Alt was elevated.  Repeat LFTs or just ALT, AST?  A1c, microalb, bmp, lipid???  Initial call taken by: Shary Decamp,  May 15, 2009 8:37 AM  Follow-up for Phone Call        just call 1 week a head of the Lab test please  Follow-up by: [facility] E. Demetrice Combes MD,  May 19, 2009 8:07 AM

## 2010-05-14 NOTE — Letter (Signed)
Summary: Primary Care Consult Scheduled Letter  [facility] [place]  [address] [place], [place] [address]   Phone: [phone]  Fax: [phone]      04/23/2009 MRN: [id number]  [facility] [address] [place], [place]  [address]    Dear Mr. BARG,    We have scheduled an appointment for you.  At the recommendation of Dr. Willow Ora, we have scheduled you a consult with Dr. Romero Belling with [facility] on 05-12-2009 at 2:30pm.  Their address is [address] [place], [place] [address]. The office phone number is [phone].  If this appointment day and time is not convenient for you, please feel free to call the office of the doctor you are being referred to at the number listed above and reschedule the appointment.    It is important for you to keep your scheduled appointments. We are here to make sure you are given good patient care.   Thank you,    Renee, Patient Care Coordinator [facility] [place]    **IF YOU ARE UNABLE TO KEEP THIS APPOINTMENT, OR NEED TO RESCHEDULE, PLEASE GIVE DR. ELLISON'S OFFICE A 24 HOUR NOTICE TO AVOID A $50 FEE**

## 2010-05-14 NOTE — Medication Information (Signed)
Summary: Medication Profile  Medication Profile   Imported By: Lanelle Bal 09/13/2009 11:32:18  _____________________________________________________________________  External Attachment:    Type:   Image     Comment:   External Document  Appended Document: Medication Profile to be reviewed on RTC

## 2010-05-14 NOTE — Letter (Signed)
Summary: [facility]  [facility]   Imported By: Lanelle Bal 07/28/2009 11:48:17  _____________________________________________________________________  External Attachment:    Type:   Image     Comment:   External Document

## 2010-05-14 NOTE — Progress Notes (Signed)
  Phone Note Other Incoming   Request: Send information Summary of Call: Request received from [facility] forwarded to [employer].

## 2010-05-14 NOTE — Progress Notes (Signed)
  Phone Note Call from Patient   Summary of Call: Patient is coming in next week for a CMP (ordered by rheum)  Patient also requesting a PSA..... ok to order PSA? Shary Decamp  July 11, 2009 10:04 AM   Follow-up for Phone Call        PSA 7-10 normal, too early to recheck [facility] E. Paz MD  July 11, 2009 1:23 PM

## 2010-05-14 NOTE — Assessment & Plan Note (Signed)
Summary: 3 MTH FU/KDC   Vital Signs:  Patient profile:   60 year old male Height:      73 inches Weight:      264 pounds BMI:     34.96 Temp:     97.3 degrees F Pulse rate:   64 / minute BP sitting:   110 / 70  Vitals Entered By: Shary Decamp (April 21, 2009 9:51 AM) CC: rov   History of Present Illness: AODM -- unable to exercise , tolerates metformin ok  POLYARTHRALGIA --was seen at [facility], seronegative RA Vs  psoriatic arthritis.  Started on methotrexate , folic acid  03/12/2009  TESTOSTERONE DEFICIENCY-- no recent f/u       Current Medications (verified): 1)  Benicar Hct 40-25 Mg  Tabs (Olmesartan Medoxomil-Hctz) .Marland Kitchen.. 1 By Mouth Qd 2)  Lovaza 1 Gm  Caps (Omega-3-Acid Ethyl Esters) .... 2 By Mouth Bid 3)  Potassium Chloride Cr 10 Meq  Tbcr (Potassium Chloride) .... 2 By Mouth Bid 4)  Metformin Hcl 1000 Mg Tabs (Metformin Hcl) .Marland Kitchen.. 1 1/2 By Mouth Two Times A Day 5)  Glimepiride 4 Mg Tabs (Glimepiride) .... 1/2 By Mouth Once Daily 6)  Mag Glycinate 200mg  .... Bid 7)  Lecithin 1200mg  .... Bid 8)  Flax Oil 1000mg  .... Bid 9)  Zinc 30mg  10)  Deha 125mg  11)  Cyanocobalamin 1000 Mcg/ml Soln (Cyanocobalamin) .Marland Kitchen.. 1 Ml Qmo 12)  Freestyle Freedom Lite Test Strips & Lancets .... Check Blood Sugar Three Times A Day  Allergies (verified): No Known Drug Allergies  Past History:  Past Medical History: AODM dx 2-09 aprox SLEEP APNEA  POLYARTHRALGIA  sees Dr Kellie Simmering was seen at [facility], seronegative RA Vs  psoriatic arthritis.  Started on methotrexate 03/12/2009 PSORIASIS CARDIAC CATHETERIZATION, LEFT--remotely SKIN CANCER, HX OF  TESTOSTERONE DEFICIENCY--sees Dr Vassie Moment   Hypertension  Past Surgical History: Reviewed history from 12/08/2006 and no changes required. Appendectomy (1993) Tonsillectomy (1960)  Social History: Reviewed history from 11/13/2008 and no changes required. 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 diet has not changed  started methotrexate 6 weeks ago, no  apparent  side effects  Physical Exam  General:  alert and well-developed.   Lungs:  normal respiratory effort, no intercostal retractions, no accessory muscle use, and normal breath sounds.   Heart:  normal rate, regular rhythm, and no murmur.   (heart rate 64 initially, I checked it again and it was in the 90s, regular) Extremities:  no edema   Impression & Recommendations:  Problem # 1:  HYPERTENSION (ICD-401.9) due for labs His updated medication list for this problem includes:    Benicar Hct 40-25 Mg Tabs (Olmesartan medoxomil-hctz) .Marland Kitchen... 1 by mouth qd  BP today: 110/70 Prior BP: 120/80 (01/14/2009)  Labs Reviewed: K+: 4.6 (11/05/2008) Creat: : 0.9 (11/05/2008)   Chol: 244 (11/05/2008)   HDL: 42.50 (11/05/2008)   LDL: 124 (09/14/2007)   TG: 350.0 (11/05/2008)  Orders: TLB-BMP (Basic Metabolic Panel-BMET) (80048-METABOL)  Problem # 2:  AODM (ICD-250.00) improved after we increase metformin, labs His updated medication list for this problem includes:    Benicar Hct 40-25 Mg Tabs (Olmesartan medoxomil-hctz) .Marland Kitchen... 1 by mouth qd  Metformin Hcl 1000 Mg Tabs (Metformin hcl) .Marland Kitchen... 1 1/2 by mouth two times a day    Glimepiride 4 Mg Tabs (Glimepiride) .Marland Kitchen... 1/2 by mouth once daily  Labs Reviewed: Creat: 0.9 (11/05/2008)    Reviewed HgBA1c results: 6.6 (04/14/2009)  7.6 (01/14/2009)  Orders: TLB-Hepatic/Liver Function Pnl (80076-HEPATIC)  Problem # 3:  POLYARTHRALGIA (ICD-719.49) now on methotrexate since we are checking blood work, will fax results  to rheumatology  Orders: TLB-Hepatic/Liver Function Pnl (80076-HEPATIC) TLB-CBC Platelet - w/Differential (85025-CBCD) Venipuncture  ([address])  Problem # 4:  TESTOSTERONE DEFICIENCY (ICD-257.2)  has not seen Dr. Everardo All while, his note  from April 2009 indicates the  need for clomid and a pituitary MRI re- refer  to Dr. Everardo All  Orders: Endocrinology Referral (Endocrine)  Problem # 5:  HYPERLIPIDEMIA (ICD-272.4) continue with lovaza His updated medication list for this problem includes:    Lovaza 1 Gm Caps (Omega-3-acid ethyl esters) .Marland Kitchen... 2 by mouth bid  Labs Reviewed: SGOT: 31 (11/05/2008)   SGPT: 48 (11/05/2008)   HDL:42.50 (11/05/2008), 43.6 (09/14/2007)  LDL:124 (09/14/2007), DEL (06/01/2007)  Chol:244 (11/05/2008), 197 (09/14/2007)  Trig:350.0 (11/05/2008), 146 (09/14/2007)  Complete Medication List: 1)  Benicar Hct 40-25 Mg Tabs (Olmesartan medoxomil-hctz) .Marland Kitchen.. 1 by mouth qd 2)  Lovaza 1 Gm Caps (Omega-3-acid ethyl esters) .... 2 by mouth bid 3)  Potassium Chloride Cr 10 Meq Tbcr (Potassium chloride) .... 2 by mouth bid 4)  Metformin Hcl 1000 Mg Tabs (Metformin hcl) .Marland Kitchen.. 1 1/2 by mouth two times a day 5)  Glimepiride 4 Mg Tabs (Glimepiride) .... 1/2 by mouth once daily 6)  Mag Glycinate 200mg   .... Bid 7)  Lecithin 1200mg   .... Bid 8)  Flax Oil 1000mg   .... Bid 9)  Zinc 30mg   10)  Deha 125mg   11)  Cyanocobalamin 1000 Mcg/ml Soln (Cyanocobalamin) .Marland Kitchen.. 1 ml qmo 12)  Freestyle Freedom Lite Test Strips & Lancets  .... Check blood sugar three times a day  Patient Instructions: 1)  Please schedule a follow-up appointment in 4 months .

## 2010-05-14 NOTE — Miscellaneous (Signed)
Summary: meds updated  Dr. Mickey Farber has discontinued methotrexate & started on Enbrel Shary Decamp  June 17, 2009 8:26 AM  Clinical Lists Changes  Medications: Added new medication of ENBREL 50 MG/ML SOLN (ETANERCEPT) qwk

## 2010-05-14 NOTE — Progress Notes (Signed)
  Phone Note Call from Patient   Caller: email from pt Summary of Call: Good morning,  I just spoke with Dr. Mickey Farber and she is going to increase the dosage for Methotrexate and hasn't received the last lab results that were done there. Apparently they need to keep an eye on liver functions.    Her fax number is: [phone]     Let me know if you have any questions.  [phone]  Thanks   Follow-up for Phone Call        faxed Follow-up by: Shary Decamp,  May 27, 2009 1:02 PM

## 2010-05-14 NOTE — Progress Notes (Signed)
Summary: ?metformin  Phone Note Call from Patient Call back at Home Phone [phone]   [facility] [address] Patient would like to change directions on metformin to as directed so he can get more per month....is that ok?  he is currently on 1 1/2 by mouth two times a day  Initial call taken by: Shary Decamp,  June 16, 2009 10:25 AM  Follow-up for Phone Call        ok as long as he continue w/ same dose Armenta Erskin E. Marzelle Rutten MD  June 16, 2009 12:47 PM     New/Updated Medications: METFORMIN HCL 1000 MG TABS (METFORMIN HCL) as directed Prescriptions: METFORMIN HCL 1000 MG TABS (METFORMIN HCL) as directed  #180 x 0   Entered by:   Shary Decamp   Authorized by:   Nolon Rod. Efstathios Sawin MD   Signed by:   Shary Decamp on 06/16/2009   Method used:   [facility] ...       [facility]  [address] [id number]       [address]       [place], [place]  [address]       Ph: [phone] or [phone]       Fax: [phone]   RxID:   [id number]

## 2010-05-26 ENCOUNTER — Telehealth (INDEPENDENT_AMBULATORY_CARE_PROVIDER_SITE_OTHER): Payer: Self-pay | Admitting: *Deleted

## 2010-06-02 ENCOUNTER — Encounter (INDEPENDENT_AMBULATORY_CARE_PROVIDER_SITE_OTHER): Payer: Self-pay | Admitting: *Deleted

## 2010-06-02 ENCOUNTER — Other Ambulatory Visit: Payer: Self-pay | Admitting: Internal Medicine

## 2010-06-02 ENCOUNTER — Other Ambulatory Visit (INDEPENDENT_AMBULATORY_CARE_PROVIDER_SITE_OTHER): Payer: BC Managed Care – PPO

## 2010-06-02 DIAGNOSIS — L4050 Arthropathic psoriasis, unspecified: Secondary | ICD-10-CM

## 2010-06-02 DIAGNOSIS — E119 Type 2 diabetes mellitus without complications: Secondary | ICD-10-CM

## 2010-06-02 DIAGNOSIS — Z79899 Other long term (current) drug therapy: Secondary | ICD-10-CM

## 2010-06-02 DIAGNOSIS — E785 Hyperlipidemia, unspecified: Secondary | ICD-10-CM

## 2010-06-02 DIAGNOSIS — I10 Essential (primary) hypertension: Secondary | ICD-10-CM

## 2010-06-02 DIAGNOSIS — Z0000 Encounter for general adult medical examination without abnormal findings: Secondary | ICD-10-CM

## 2010-06-02 LAB — BASIC METABOLIC PANEL
BUN: 13 mg/dL (ref 6–23)
Calcium: 9.4 mg/dL (ref 8.4–10.5)
GFR: 113.05 mL/min (ref >60.00)
Glucose, Bld: 140 mg/dL — ABNORMAL HIGH (ref 70–99)
Sodium: 139 mEq/L (ref 135–145)

## 2010-06-02 LAB — LIPID PANEL
Cholesterol: 169 mg/dL (ref 0–200)
HDL: 36.2 mg/dL — ABNORMAL LOW (ref >39.00)
LDL Cholesterol: 110 mg/dL — ABNORMAL HIGH (ref 0–99)
VLDL: 22.8 mg/dL (ref 0.0–40.0)

## 2010-06-02 LAB — CBC WITH DIFFERENTIAL/PLATELET
Basophils Absolute: 0 10*3/uL (ref 0.0–0.1)
Lymphocytes Relative: 38.6 % (ref 12.0–46.0)
Monocytes Relative: 9.4 % (ref 3.0–12.0)
Platelets: 214 10*3/uL (ref 150.0–400.0)
RDW: 13.5 % (ref 11.5–14.6)

## 2010-06-02 LAB — HEMOGLOBIN A1C: Hgb A1c MFr Bld: 7.6 % — ABNORMAL HIGH (ref 4.6–6.5)

## 2010-06-02 LAB — AST: AST: 46 U/L — ABNORMAL HIGH (ref 0–37)

## 2010-06-02 LAB — PSA: PSA: 0.32 ng/mL (ref 0.10–4.00)

## 2010-06-03 LAB — SEDIMENTATION RATE: Sed Rate: 9 mm/hr (ref 0–22)

## 2010-06-03 LAB — MICROALBUMIN / CREATININE URINE RATIO: Microalb, Ur: 2.5 mg/dL — ABNORMAL HIGH (ref 0.0–1.9)

## 2010-06-03 NOTE — Progress Notes (Signed)
Summary: need lab orders and diag codes for Jun 16, 2022 lab--added  Phone Note Call from Patient   Caller: Patient Summary of Call: has CPX for 2/28---says he always has his labs the week before, so he has a lab appt for 2/21----what orders and diag codes do I need? Initial call taken by: Jerolyn Shin,  May 26, 2010 9:24 AM  Follow-up for Phone Call        he needs a physical exam. labs: PSA---dx v70 Hemoglobin A1c microalbumin ---- dx diabetes AST, ALT, FLP---- dx  hypercholesterol BMP--dx hypertension Jose E. Paz MD  May 26, 2010 12:08 PM   Additional Follow-up for Phone Call Additional follow up Details #1::        added codes for labs, CPX is still on schedule for 2/28 Additional Follow-up by: Jerolyn Shin,  May 28, 2010 10:11 AM

## 2010-06-09 ENCOUNTER — Encounter: Payer: Self-pay | Admitting: Internal Medicine

## 2010-06-09 ENCOUNTER — Encounter (INDEPENDENT_AMBULATORY_CARE_PROVIDER_SITE_OTHER): Payer: BC Managed Care – PPO | Admitting: Internal Medicine

## 2010-06-09 ENCOUNTER — Telehealth: Payer: Self-pay | Admitting: Internal Medicine

## 2010-06-09 DIAGNOSIS — E559 Vitamin D deficiency, unspecified: Secondary | ICD-10-CM | POA: Insufficient documentation

## 2010-06-09 DIAGNOSIS — Z0000 Encounter for general adult medical examination without abnormal findings: Secondary | ICD-10-CM

## 2010-06-09 DIAGNOSIS — D518 Other vitamin B12 deficiency anemias: Secondary | ICD-10-CM

## 2010-06-09 DIAGNOSIS — R948 Abnormal results of function studies of other organs and systems: Secondary | ICD-10-CM

## 2010-06-09 DIAGNOSIS — E119 Type 2 diabetes mellitus without complications: Secondary | ICD-10-CM

## 2010-06-09 DIAGNOSIS — E785 Hyperlipidemia, unspecified: Secondary | ICD-10-CM

## 2010-06-16 ENCOUNTER — Encounter: Payer: Self-pay | Admitting: Internal Medicine

## 2010-06-18 NOTE — Assessment & Plan Note (Signed)
Summary: physical --had labs last week///sph   Vital Signs:  Patient profile:   60 year old male Height:      73 inches Weight:      257.50 pounds BMI:     34.10 Pulse rate:   76 / minute Pulse rhythm:   regular BP sitting:   134 / 88  (left arm) Cuff size:   large  Vitals Entered By: Army Fossa CMA (June 09, 2010 1:28 PM) CC: CPX, not fasting  Comments no complaints [facility] [address] not had colonoscopy   History of Present Illness: CPX, chart reviewed    about 4 weeks ago, he changed his diet, eating healthier, he also loss of weight about 10 pounds.  Diabetes, after he changed his lifestyle his CBGs started to drop in the 70s, he self discontinued glimepiride.  Hypertension  about 3 weeks ago he noticed his blood pressure to start dropping, he self discontinued Benicar HCT , his blood pressure was initially 100/70 and currently is running around 120/70 at home. Pulses in the 70s.     hypogonadism, has not seen his endocrinologist in a while   Current Medications (verified): 1)  Benicar Hct 40-25 Mg  Tabs (Olmesartan Medoxomil-Hctz) .Marland Kitchen.. 1 By Mouth Qd 2)  Lovaza 1 Gm  Caps (Omega-3-Acid Ethyl Esters) .... 2 By Mouth Bid 3)  Potassium Chloride Cr 10 Meq  Tbcr (Potassium Chloride) .... 2 By Mouth Bid 4)  Metformin Hcl 1000 Mg Tabs (Metformin Hcl) .... As Directed 5)  Glimepiride 4 Mg Tabs (Glimepiride) .... 1/2 By Mouth Once Daily. Due For Office Visit Before Additional Refills. 6)  Mag Glycinate 200mg  .... Bid 7)  Lecithin 1200mg  .... Bid 8)  Flax Oil 1000mg  .... Bid 9)  Zinc 30mg  10)  Deha 125mg  11)  Cyanocobalamin 1000 Mcg/ml Soln (Cyanocobalamin) .Marland Kitchen.. 1 Ml Qmo 12)  Freestyle Freedom Lite Test Strips & Lancets .... Check Blood Sugar Three Times A Day 13)  Enbrel 50 Mg/ml Soln (Etanercept) .... Qwk 14)  Freestyle Lite Test  Strp (Glucose Blood) .... As Directred.  Allergies (verified): No Known Drug Allergies  Past History:  Past Medical History: AODM  dx 2-09 aprox h/o SLEEP APNEA remotely, no treatment psoriatic  arthritis -- seen at [facility] . Started on methotrexate 03/12/2009, switch to enbrel aprox 2-11 CARDIAC CATHETERIZATION, LEFT--remotely SKIN CANCER, remotely, 4th L finger  , apparently not a melanoma TESTOSTERONE DEFICIENCY  HYPERLIPIDEMIA   Hypertension h/o kidney stones   Past Surgical History: Reviewed history from 12/08/2006 and no changes required. Appendectomy (1993) Tonsillectomy (1960)  Family History: Reviewed history from 07/06/2007 and no changes required. Father: deceased cerebral hemorrhage; stroke x 2 HTN--mother Siblings: glaucoma asthma--GM lung Ca--GM prostate ca--no colon ca--no MI--F DM--F  Social History: Married 1 daughter works on  Medical sales representative-- better exercise -- better  tobacco-- quit at age 53 ETOH-- no  Review of Systems General:  Denies fatigue and fever. CV:  Denies chest pain or discomfort and swelling of feet. Resp:  Denies cough and shortness of breath. GI:  Denies bloody stools, nausea, and vomiting. GU:  Denies hematuria, urinary frequency, and urinary hesitancy; urinated a stone last week, no pain or gross hematuria . Psych:  Denies anxiety and depression.  Physical Exam  General:  alert, well-developed, and overweight-appearing.   Neck:  no masses, no thyromegaly, and normal carotid upstroke.   Lungs:  normal respiratory effort, no intercostal retractions, no accessory muscle use, and normal breath sounds.   Heart:  normal rate, regular rhythm, no murmur, and no gallop.   Abdomen:  soft, non-tender, no distention, no masses, no guarding, and no rigidity.   Rectal:  No external abnormalities noted. Normal sphincter tone. No rectal masses or tenderness. hem neg  Prostate:  Prostate gland firm and smooth, no enlargement, nodularity, tenderness, mass, asymmetry or induration. Psych:  Cognition and judgment appear intact. Alert and cooperative with normal  attention span and concentration    Impression & Recommendations:  Problem # 1:  HEALTH SCREENING (ICD-V70.0) CHART REVIEWED  last TD 8- 2010  never had a colonoscopy--- referal    all labs discussed , PSA normal  EKG today  sinus tachycardia without acute changes, patient was anxious about his results   encouraged to continue with his healthy lifestyle    Orders: Gastroenterology Referral (GI) EKG w/ Interpretation (93000)  Problem # 2:  HYPERTENSION (ICD-401.9) self  discontinue Benicar Hct  about 4 weeks ago due to a better lifestyle and  dropping blood pressures   ambulatory BPs are going up lately.    plan:  discontinue potassium supplements Restart Benicar 40 mg daily without HCTZ  The following medications were removed from the medication list:    Benicar Hct 40-25 Mg Tabs (Olmesartan medoxomil-hctz) .Marland Kitchen... 1 by mouth qd His updated medication list for this problem includes:    Benicar 40 Mg Tabs (Olmesartan medoxomil) .Marland Kitchen... 1 by mouth once daily  BP today: 134/88 Prior BP: 122/76 (10/07/2009)  Labs Reviewed: K+: 4.0 (07/17/2009) Creat: : 0.9 (07/17/2009)   Chol: 186 (09/30/2009)   HDL: 40.10 (09/30/2009)   LDL: 124 (09/14/2007)   TG: 291.0 (09/30/2009)  Problem # 3:  AODM (ICD-250.00)  last  Hemoglobin A1C ----> 7.6 %        treatment options : wait vs  restart glimepiride 2 mg tablet half tablet  we elected to wait and reasses in 3 months  since  he is improving his diet. LFTs slightly  elevated but stable, stay on metformin for now   The following medications were removed from the medication list:    Benicar Hct 40-25 Mg Tabs (Olmesartan medoxomil-hctz) .Marland Kitchen... 1 by mouth qd    Glimepiride 4 Mg Tabs (Glimepiride) .Marland Kitchen... 1/2 by mouth once daily. due for office visit before additional refills. His updated medication list for this problem includes:    Metformin Hcl 1000 Mg Tabs (Metformin hcl) .Marland Kitchen... As directed    Benicar 40 Mg Tabs (Olmesartan medoxomil) .Marland Kitchen... 1  by mouth once daily  Labs Reviewed: Creat: 0.9 (07/17/2009)    Reviewed HgBA1c results: 6.9 (09/30/2009)  6.8 (05/30/2009)  Problem # 4:  TESTOSTERONE DEFICIENCY (ICD-257.2)  has not seen endocrinology or have his blood rechecked in years   aware of risks of osteoporosis Recheck on followup  Problem # 5:  ANEMIA, B12 DEFICIENCY (ICD-281.1) reports good compliance with shots His updated medication list for this problem includes:    Cyanocobalamin 1000 Mcg/ml Soln (Cyanocobalamin) .Marland Kitchen... 1 ml qmo  Hgb: 18.2 (09/30/2009)   Hct: 53.3 (09/30/2009)   Platelets: 213.0 (09/30/2009) RBC: 5.51 (09/30/2009)   RDW: 13.0 (09/30/2009)   WBC: 8.1 (09/30/2009) MCV: 96.8 (09/30/2009)   MCHC: 34.1 (09/30/2009) B12: 993 (08/10/2007)   Folate: 3.5 (12/08/2006)   TSH: 1.32 (11/05/2008)  Problem # 6:  UNSPECIFIED VITAMIN D DEFICIENCY (ICD-268.9)  history of vitamin D deficiency. Recheck on RTC   Problem # 7:  FUNCTION TESTS, ABNORMAL (ICD-794.9)  recent LFTs elevated, AST 46, ALT 75  Patient does not  drink, related to Enbrel ? LFTs ok until 1-11 aound the time the patient tarted enbrel needs a chronic hepatitis panel on RTC  Problem # 8:  willl send all records to Dr [facility]   Complete Medication List: 1)  Lovaza 1 Gm Caps (Omega-3-acid ethyl esters) .... 2 by mouth bid 2)  Metformin Hcl 1000 Mg Tabs (Metformin hcl) .... As directed 3)  Cyanocobalamin 1000 Mcg/ml Soln (Cyanocobalamin) .Marland Kitchen.. 1 ml qmo 4)  Enbrel 50 Mg/ml Soln (Etanercept) .... Qwk 5)  Mag Glycinate 200mg   .... Bid 6)  Lecithin 1200mg   .... Bid 7)  Flax Oil 1000mg   .... Bid 8)  Zinc 30mg   9)  Deha 125mg   10)  Freestyle Freedom Lite Test Strips & Lancets  .... Check blood sugar three times a day 11)  Freestyle Lite Test Strp (Glucose blood) .... As directred. 12)  Benicar 40 Mg Tabs (Olmesartan medoxomil) .Marland Kitchen.. 1 by mouth once daily  Patient Instructions: 1)  stop potassium, benicar 40mg  1 a day 2)  Please schedule a  follow-up appointment in 3 months .  Prescriptions: BENICAR 40 MG TABS (OLMESARTAN MEDOXOMIL) 1 by mouth once daily  #90 x 1   Entered and Authorized by:   Elita Quick E. Griselda Bramblett MD   Signed by:   Nolon Rod. Edrick Whitehorn MD on 06/09/2010   Method used:   [facility]  [address] [id number]       [address]       [place], [place]  [address]       Ph: [phone] or [phone]       Fax: [phone]   RxID:   [id number]    Orders Added: 1)  Gastroenterology Referral [GI] 2)  EKG w/ Interpretation [93000] 3)  Est. Patient age 51-64 [68] 4)  Est. Patient Level III [phone]

## 2010-06-18 NOTE — Progress Notes (Signed)
Summary: need lab orders for 5/23  Phone Note Call from Patient   Caller: Patient Summary of Call: patient has followup appt on 5/30---says he is supposed to have labs before so I scheduled him for labwork on 5/23----does he need labs???   If so, what orders and codes please??     thank you Initial call taken by: Jerolyn Shin,  June 09, 2010 2:49 PM  Follow-up for Phone Call         advise  patient I will check labs @ the time of the visit, no need to fast Wacey Zieger E. Yomaira Solar MD  June 10, 2010 1:24 PM   Additional Follow-up for Phone Call Additional follow up Details #1::        Patient notified.  Additional Follow-up by: Lucious Groves CMA,  June 10, 2010 3:20 PM

## 2010-06-23 NOTE — Letter (Signed)
Summary: Pre Visit Letter Revised  [facility]  [address] [place], [place] [address]   Phone: [phone]  Fax: [phone]        06/16/2010 MRN: [id number]  [facility] [address] [place], [place]  [address]  [place]             Procedure Date:  07-16-10 11:30am            Dr Leone Payor Direct Colon   Welcome to the Gastroenterology Division at [facility].    You are scheduled to see a nurse for your pre-procedure visit on 07-01-10 at 4pm on the 3rd floor at [facility], [address]  We ask that you try to arrive at our office 15 minutes prior to your appointment time to allow for check-in.  Please take a minute to review the attached form.  If you answer "Yes" to one or more of the questions on the first page, we ask that you call the person listed at your earliest opportunity.  If you answer "No" to all of the questions, please complete the rest of the form and bring it to your appointment.    Your nurse visit will consist of discussing your medical and surgical history, your immediate family medical history, and your medications.   If you are unable to list all of your medications on the form, please bring the medication bottles to your appointment and we will list them.  We will need to be aware of both prescribed and over the counter drugs.  We will need to know exact dosage information as well.    Please be prepared to read and sign documents such as consent forms, a financial agreement, and acknowledgement forms.  If necessary, and with your consent, a friend or relative is welcome to sit-in on the nurse visit with you.  Please bring your insurance card so that we may make a copy of it.  If your insurance requires a referral to see a specialist, please bring your referral form from your primary care physician.  No co-pay is required for this nurse visit.     If you cannot keep your appointment, please call [phone] to cancel or reschedule  prior to your appointment date.  This allows [place] the opportunity to schedule an appointment for another patient in need of care.    Thank you for choosing [facility] for your medical needs.  We appreciate the opportunity to care for you.  Please visit [place] at our website  to learn more about our practice.  Sincerely, The Gastroenterology Division

## 2010-07-16 ENCOUNTER — Other Ambulatory Visit: Payer: BC Managed Care – PPO | Admitting: Internal Medicine

## 2010-08-02 ENCOUNTER — Other Ambulatory Visit: Payer: Self-pay | Admitting: Internal Medicine

## 2010-08-06 ENCOUNTER — Other Ambulatory Visit: Payer: Self-pay | Admitting: Internal Medicine

## 2010-08-07 ENCOUNTER — Other Ambulatory Visit: Payer: Self-pay | Admitting: *Deleted

## 2010-08-07 NOTE — Telephone Encounter (Signed)
Message left for patient to return my call.  

## 2010-08-07 NOTE — Telephone Encounter (Signed)
Per [facility]- it was d/c by Dr.Ellison in 2009.

## 2010-08-07 NOTE — Telephone Encounter (Signed)
Currently not taking testosterone. Needs to see endocrinology

## 2010-08-07 NOTE — Telephone Encounter (Signed)
I spoke w/ pt he is aware.  

## 2010-08-24 ENCOUNTER — Telehealth: Payer: Self-pay | Admitting: *Deleted

## 2010-08-24 NOTE — Telephone Encounter (Signed)
[place], [place] an appt on May 30th. He states he is not going to make it back for the appt. Pt states if you want him to he can find a lab there to do bloodwork if need be. Please advise.

## 2010-08-28 NOTE — Cardiovascular Report (Signed)
Samuel Meyer, Samuel Meyer                ACCOUNT NO.:  [id number]   MEDICAL RECORD NO.:  [id number]          PATIENT TYPE:  INP   LOCATION:  2852                         FACILITY:  [facility]   PHYSICIAN:  Meade Maw, M.D.    DATE OF BIRTH:  October 26, 1950   DATE OF PROCEDURE:  12/04/2004  DATE OF DISCHARGE:  12/04/2004                              CARDIAC CATHETERIZATION   INDICATION FOR STUDY:  This is a 60 year old male with chest pain,  increasing shortness of breath. Shihab is a 60 year old male with  hypertension, dyslipidemia who relates a one-month history of increasing  dyspnea with exertion. He has had six to seven episodes of nocturnal dyspnea  as well. He has had no orthopnea. The dyspnea is relieved by sitting in the  upright position. Today he presents to the ER with complaints of shortness  breath and chest pressure. His troponin I was marginally elevated at 0.07.  His coronary risk factors are significant for male sex, hypertension, and  dyslipidemia.   PROCEDURE:  Left heart catheterization.   The patient was brought to the left heart catheterization lab in the  postabsorptive state on an urgent basis. The right groin was prepped and  draped in the usual sterile fashion. Local anesthesia was achieved using 1%  Xylocaine. A 6-French hemostasis sheath was placed into the right femoral  artery using a modified Seldinger technique. Selective coronary angiography  was performed using JL-4, JR-4 Judkins catheter. Multiple views were  obtained. All catheter exchanges were made over a guide wire. Single-plane  ventriculogram was performed in the RAO position using a 6-French pigtail  curved catheter.   FINDINGS:  Aortic pressure is 99/75, LV pressure is 99/40. EDP is 10.   Single-plane ventriculogram revealed normal wall motion, ejection fraction  of 65%. There is no mitral regurgitation. The patient was noted to have  unusual trabeculation of the left ventricle. This was demonstrated  by small  pediculated protrusions which would fill readily with contrast.   CORONARY ANGIOGRAPHY:  Left main coronary artery was long. It bifurcated  into the left anterior, descending and circumflex vessel. There was no  disease noted in the left main coronary artery.   Left anterior descending:  The left anterior descending was a large artery.  It gave rise to a very large first diagonal. A smaller second diagonal goes  on in as an apical branch. There was no disease noted in the left anterior  descending or its branches.   The circumflex vessel:  Circumflex vessel was a small vessel. It gives rise  to a moderate obtuse marginal. Ends early on the lateral portion of the  heart. There was no significant disease noted in the circumflex or its  branches.   The right coronary artery:  The right coronary artery was a very large  dominant artery that provided most of the posterior lateral circulation. It  gives rise to trivial RV marginals, a large trifurcating PDA. There was no  disease noted in the right coronary artery or its branches.   FINAL IMPRESSION:  1.  Normal single-plane ventriculogram with an ejection  fraction of 60-65%.      There are unusual trabeculations of the left ventricle. I will review      this finding with my cohorts. At this time, I do not feel it has      clinical      significance.  2.  Normal coronary angiography.  3.  Other etiologies for his dyspnea should be considered.      Meade Maw, M.D.  Electronically Signed     HP/MEDQ  D:  12/04/2004  T:  12/05/2004  Job:  [id number]   cc:   Norva Pavlov, M.D.  [address]  [place]  [place] [address]  Fax: [phone]

## 2010-08-28 NOTE — Procedures (Signed)
[name], Samuel Meyer                ACCOUNT NO.:  [id number]   MEDICAL RECORD NO.:  [id number]          PATIENT TYPE:  OUT   LOCATION:  SLEEP CENTER                 FACILITY:  [facility]   PHYSICIAN:  Clinton D. Maple Hudson, M.D. DATE OF BIRTH:  1950-07-20   DATE OF STUDY:  08/11/2005                              NOCTURNAL POLYSOMNOGRAM   REFERRING PHYSICIAN:  Dr. Jetty Duhamel.   DATE OF STUDY:  Aug 11, 2005.   INDICATION FOR STUDY:  Insomnia with sleep apnea.  Epworth sleepiness score  9/24, BMI 33, weight 250 pounds.  Home medication:  Benicar HCT,  testosterone, Lunesta.   SLEEP ARCHITECTURE:  Total sleep time 219 minutes with sleep efficiency 60%.  Stage I was 21%, stage II 79%, stages III, IV and REM were absent.  Sleep  latency 55 minutes, awake after sleep onset 92 minutes, arousal index  markedly increased at 59.6 reflecting significant sleep fragmentation.  No  bedtime medication was taken.   RESPIRATORY DATA:  Apnea-hypopnea index (AHI, RDI) 45.9 obstructive events  per hour indicating moderately severe obstructive sleep apnea/hypopnea  syndrome.  This reflected 19 obstructive apneas and 149 hypopneas.  Events  were not positional.  He slept little supine but significant numbers of  events were recorded while on either side.  REM AHI N/A.  He did not sustain  sleep sufficiently to permit CPAP titration by split protocol.  The  technician describes spending considerable time working with him before the  study, trying to obtain a CPAP mask which he would tolerate.  She indicated  he would likely need considerable help adjusting to CPAP if prescribed  later.   OXYGEN DATA:  Moderate snoring with complaint of nasal congestion.  Oxygen  desaturation to a nadir of 85%.  Mean oxygen saturation through the study  was 94% on room air.   CARDIAC DATA:  Sinus rhythm.   MOVEMENT/PARASOMNIA:  Occasional limb jerk, insignificant.  Bathroom x1.  He  slept on one pillow plus a small wedge  which he uses at home.  He requested  that a night light stay on.   IMPRESSION/RECOMMENDATION:  1.  Moderately severe obstructive sleep apnea/hypopnea syndrome, apnea-      hypopnea index 45.9 per hour with nonpositional events, moderate snoring      and oxygen desaturation to a nadir of 85%.  2.  Consider return for continuous positive airway pressure titration      bringing a sleep medication and noting the technician needed some time      to get the patient adjusted to potential for continuous positive airway      pressure on this initial night.  3.  Increased sleep fragmentation and difficulty maintaining sleep,      consistent with his presenting complaint.  Most arousals appeared      related to respiratory events.      Clinton D. Maple Hudson, M.D.  Diplomate, Biomedical engineer of Sleep Medicine  Electronically Signed     CDY/MEDQ  D:  08/15/2005 12:01:19  T:  08/16/2005 13:17:08  Job:  [id number]

## 2010-08-28 NOTE — H&P (Signed)
Samuel Meyer, Samuel Meyer NO.:  [id number]   MEDICAL RECORD NO.:  [id number]          PATIENT TYPE:  EMS   LOCATION:  MAJO                         FACILITY:  [facility]   PHYSICIAN:  Meade Maw, M.D.    DATE OF BIRTH:  1951/03/25   DATE OF ADMISSION:  12/04/2004  DATE OF DISCHARGE:                                HISTORY & PHYSICAL   PRIMARY CARE PHYSICIAN:  Dr. Allena Napoleon.   CHIEF COMPLAINT:  Chest pain and shortness of breath.   HISTORY OF PRESENT ILLNESS:  Samuel Meyer is a 60 year old male patient with  a prior history of hypertension and dyslipidemia.  He relates a one-month  history of shortness of breath which is not exertional, usually nocturnal.  He has had about six or seven occurrences since the initial onset.  The  patient states that the shortness of breath is significant enough that he  must get up out of bed and sit up to obtain any relief.  He has not had any  chest pain with these symptoms until last night.  About 1:30 a.m. he was  awakened with once again with shortness of breath.  This persisted until  about 6:30 a.m., until he developed mid-sternal chest pressure, non-  radiating.  This chest pressure lasted for about 1-1/2 hours before it  decreased on its own at about 8 a.m.  He continues with waxing and waning  chest pressure, but not as severe as last evening.  He has mild shortness of  breath continuing as well.  No exertional chest pain.  In the emergency room  the patient was started on oxygen and IV nitroglycerin.  He had some  increased pressure by the time I arrived to see him, so his nitroglycerin  was recently bumped up.  He has been given 5000 units of IV heparin and has  been given aspirin in the emergency room.  His electrocardiogram is negative  for any ischemic changes, but his first troponin I is mildly elevated at  0.07.   REVIEW OF SYSTEMS:  Negative.  No recent cough, fevers, chills, colds, flu  or myalgias.  No reflux,  no abdominal pain.  No indigestion.  No dark or  bloody stools.  No hematuria, no dysuria.  No lower extremity swelling, no  palpitations, no dizziness, no syncope or near syncope.   PAST MEDICAL HISTORY:  1.  Hypertension.  2.  Dyslipidemia.  3.  Pb/Hg toxicity.  4.  Homocystinemia.   SOCIAL HISTORY:  He is a former tobacco user.  He quit 25 years ago.  He  smoked 1/2 pack a day.  He does not drink alcohol.  He does not use illicit  drugs.  He swims 20 laps daily.  He works in Airline pilot.   FAMILY HISTORY:  Father with a history of coronary artery disease beginning  in his 31's.  He had his first coronary artery bypass graft surgery at that  time and had a redo coronary artery bypass graft surgery in his 76's.  Father also had diabetes.  Mother has hypothyroidism and hypertension.  ALLERGIES:  No known drug allergies.   CURRENT MEDICATIONS:  1.  Benicar 20/12.5 mg daily.  2.  Niacin 500 mg daily.  3.  Aspirin 325 mg daily 30 minutes before the niacin dosing daily.   PHYSICAL EXAMINATION:  GENERAL:  This is a pleasant male currently  complaining of waxing and waning chest pressure and a one-month history of  nocturnal shortness of breath.  VITAL SIGNS:  Temperature 97.8 degrees, blood pressure 136/76, pulse 94 and  regular, respirations 20.  HEENT:  Head normocephalic.  Sclerae not injected.  NECK:  Supple, no adenopathy.  NEUROLOGIC:  The patient is alert and oriented x3, moving all extremities  x4.  No focal neurological deficits.  CHEST:  Bilateral lung sounds are clear to auscultation posteriorly.  Respiratory effort is non-labored.  There is no wheeze.  He is saturating  90% on room air and 99% with nasal cannula O2.  HEART:  Sounds S1, S2.  No rubs, murmurs, thrills or gallops.  No jugular  venous distention.  Carotids are 2+ bilaterally without bruits.  ABDOMEN:  Soft, nontender, non-distended, without hepatosplenomegaly, masses  or bruits.  EXTREMITIES:  Symmetric in  appearance without clubbing, cyanosis or edema.  Pulses palpable bilaterally.  Radial, femoral and pedal 1-2+.   LABORATORY DATA:  WBC 6,400, hemoglobin 16.7, platelets 225,000.  Sodium  138, potassium 3.7, glucose 110, BUN 13, creatinine 0.9.  Liver function  tests within normal limits.  Beta natriuretic peptide is less than 30.  Myoglobin 32.8, MB less than 1.0, troponin I 0.07.   DIAGNOSTICS:  The diagnostics and electrocardiogram were done here at [facility] and showed a sinus rhythm with normal R-wave  progression, no ischemic changes.  There is an electrocardiogram from Dr.  Lanora Manis Vaughn's office from today's visit, and that again shows sinus  rhythm without any ischemic changes.   A chest x-ray is pending at the time of dictation.   IMPRESSION:  1.  Chest pain, unstable angina, possible early acute coronary syndrome.  2.  Hypertension, controlled.  3.  Polycythemia and homocystinemia.  4.  Dyslipidemia.   PLAN:  1.  The patient's symptoms as well as his personal and medical family      history are quite concerning for premature coronary artery disease.      Because of this, Dr. Meade Maw plans a diagnostic coronary      catheterization today, given the persistence of symptoms and level of      the troponin I.  2.  If the electrocardiogram is negative, the patient is possibly in the      early stages of a non-ST segment elevated myocardial infarction.  3.  At the present time, will continue IV nitroglycerin and IV heparin until      he goes to the cardiac catheterization laboratory.  Add a beta blocker      as his blood pressure tolerates.  4.  Additional treatment based on the cardiac catheterization findings.  5.  For his hypertension, will continue Benicar and hydrochlorothiazide for      now.  Serum creatinine 0.9.  6.  Hemoglobin is stable.  Continue to monitor.  7.  His current lipid status is unknown.  The patient tells me that in the      past month he has been told that he has an elevated cholesterol.  Will      check a fasting lipid panel and he will continue his niacin.  Allison L. Joretta Bachelor, M.D.  Electronically Signed    ALE/MEDQ  D:  12/04/2004  T:  12/04/2004  Job:  [id number]   cc:   Allena Napoleon  [address]  [place]  [place] [address]  Fax: [phone]

## 2010-08-28 NOTE — H&P (Signed)
NAMEJOHNNY, GORTER                ACCOUNT NO.:  [id number]   MEDICAL RECORD NO.:  [id number]          PATIENT TYPE:  INP   LOCATION:  1830                         FACILITY:  [facility]   PHYSICIAN:  Meade Maw, M.D.    DATE OF BIRTH:  1950-05-19   DATE OF ADMISSION:  12/04/2004  DATE OF DISCHARGE:                                HISTORY & PHYSICAL   ADDENDUM:  In addition to the areas mentioned and the initial review of  systems on the H&P, I also questioned the patient about any recent long  distance travel and immobility issues and he denies any problem with being  on bed rest recently or having experienced long distance travel without  getting up to walk.      Allison L. Joretta Bachelor, M.D.  Electronically Signed    ALE/MEDQ  D:  12/04/2004  T:  12/04/2004  Job:  [id number]

## 2010-08-28 NOTE — Telephone Encounter (Signed)
Pt is aware.  

## 2010-08-28 NOTE — Telephone Encounter (Signed)
he will be due for an appointment at the end of May. Just reschedule an appointment for a office visit. I will do labs when he comes.

## 2010-09-02 ENCOUNTER — Other Ambulatory Visit: Payer: BC Managed Care – PPO

## 2010-09-09 ENCOUNTER — Ambulatory Visit (INDEPENDENT_AMBULATORY_CARE_PROVIDER_SITE_OTHER): Payer: BC Managed Care – PPO | Admitting: Internal Medicine

## 2010-09-09 ENCOUNTER — Encounter: Payer: Self-pay | Admitting: Internal Medicine

## 2010-09-09 ENCOUNTER — Ambulatory Visit: Payer: BC Managed Care – PPO | Admitting: Internal Medicine

## 2010-09-09 DIAGNOSIS — R948 Abnormal results of function studies of other organs and systems: Secondary | ICD-10-CM

## 2010-09-09 DIAGNOSIS — I10 Essential (primary) hypertension: Secondary | ICD-10-CM

## 2010-09-09 DIAGNOSIS — E291 Testicular hypofunction: Secondary | ICD-10-CM

## 2010-09-09 DIAGNOSIS — E119 Type 2 diabetes mellitus without complications: Secondary | ICD-10-CM

## 2010-09-09 LAB — BASIC METABOLIC PANEL
GFR: 93.92 mL/min (ref >60.00)
Potassium: 4.3 mEq/L (ref 3.5–5.1)
Sodium: 137 mEq/L (ref 135–145)

## 2010-09-09 LAB — HEMOGLOBIN A1C: Hgb A1c MFr Bld: 7.3 % — ABNORMAL HIGH (ref 4.6–6.5)

## 2010-09-09 LAB — HEPATIC FUNCTION PANEL
Albumin: 4.3 g/dL (ref 3.5–5.2)
Alkaline Phosphatase: 43 U/L (ref 39–117)
Bilirubin, Direct: 0.1 mg/dL (ref 0.0–0.3)

## 2010-09-09 LAB — HEPATITIS B SURFACE ANTIBODY,QUALITATIVE: Hep B S Ab: NEGATIVE

## 2010-09-09 MED ORDER — OMEGA-3-ACID ETHYL ESTERS 1 G PO CAPS: 2.0000 g | ORAL_CAPSULE | Freq: Two times a day (BID) | ORAL | Status: DC

## 2010-09-09 MED ORDER — OLMESARTAN MEDOXOMIL 40 MG PO TABS: 40.0000 mg | ORAL_TABLET | Freq: Every day | ORAL | Status: DC

## 2010-09-09 MED ORDER — GLIMEPIRIDE 4 MG PO TABS: ORAL_TABLET | ORAL | Status: DC

## 2010-09-09 MED ORDER — METFORMIN HCL 1000 MG PO TABS: ORAL_TABLET | ORAL | Status: DC

## 2010-09-09 NOTE — Assessment & Plan Note (Signed)
Has improved his diet, labs

## 2010-09-09 NOTE — Assessment & Plan Note (Addendum)
In the past, he has seen Dr. Marti Sleigh and Dr. Talmage Nap, was recommended to take testosterone. Given his  other problems, the patient will consult with rheumatology before we start HRT. Consider re-consult endocrinology

## 2010-09-09 NOTE — Assessment & Plan Note (Signed)
Seems stable. Labs

## 2010-09-09 NOTE — Assessment & Plan Note (Addendum)
See previous entries, needs chronic hepatitis panel.

## 2010-09-09 NOTE — Progress Notes (Signed)
  Subjective:    Patient ID: Samuel Meyer, male    DOB: [birth year], 60 y.o.   MRN: [id number]  HPI Routine office visit  Past Medical History  Diagnosis Date  . Diabetes mellitus 2/09  . Sleep apnea     no treatment  . Psoriatic arthritis     [facility] started on methotrexate 03/12/09, switch to enbrel aprox 2/11  . S/P cardiac cath   . Skin cancer     remotely, 4th L finger, not melanoma  . Testosterone deficiency   . Hyperlipidemia   . Hypertension   . Kidney stone    Past Surgical History  Procedure Date  . Appendectomy   . Tonsillectomy      Review of Systems Doing well, as far as the diabetes, he is stay on metformin and glimepiride. Diet and exercise has improved. Blood sugars always less than 145. Hypertension, at the last office visit we discontinue HCTZ, he is on Benicar. Good ambulatory blood pressure readings.     Objective:   Physical Exam  Constitutional: He appears well-developed.  Cardiovascular: Normal rate, regular rhythm and normal heart sounds.   No murmur heard. Pulmonary/Chest: Effort normal and breath sounds normal. No respiratory distress. He has no wheezes. He has no rales.  Musculoskeletal: He exhibits no edema.          Assessment & Plan:

## 2010-09-10 ENCOUNTER — Ambulatory Visit: Payer: BC Managed Care – PPO | Admitting: Internal Medicine

## 2010-09-11 ENCOUNTER — Telehealth: Payer: Self-pay | Admitting: *Deleted

## 2010-09-11 NOTE — Telephone Encounter (Signed)
Message copied by Leanne Lovely on Fri Sep 11, 2010  9:36 AM ------      Message from: Willow Ora E      Created: Thu Sep 10, 2010  6:40 PM       Advise patient:      Liver test negative, no evidence of hepatitis. Good results.      Diabetes needs better control      --currently on Glimepiride 4mg  1/2 tab a day, recommend to increase to one tablet daily.      --Also increase metformin 1000 mg to 1.5 tablet twice a day

## 2010-09-11 NOTE — Telephone Encounter (Signed)
I spoke w/ pt he is aware- will mail results.

## 2010-10-08 ENCOUNTER — Telehealth: Payer: Self-pay | Admitting: *Deleted

## 2010-10-08 MED ORDER — GLIMEPIRIDE 4 MG PO TABS: 4.0000 mg | ORAL_TABLET | Freq: Every day | ORAL | Status: DC

## 2010-10-08 MED ORDER — GLIMEPIRIDE 4 MG PO TABS: ORAL_TABLET | ORAL | Status: DC

## 2010-10-08 NOTE — Telephone Encounter (Signed)
Pt called stating that his fasting Blood sugar is still high with the new med change. This am it was 147. Please advise.

## 2010-10-08 NOTE — Telephone Encounter (Signed)
Pt is aware, he is in [place], will send a month to a pharmacy there and meds local pharm.

## 2010-10-08 NOTE — Telephone Encounter (Signed)
Increase glimepiride 4mg  to 1.5 tabs a day,call a new Rx

## 2010-10-22 ENCOUNTER — Telehealth: Payer: Self-pay | Admitting: *Deleted

## 2010-10-22 NOTE — Telephone Encounter (Signed)
Pt is aware.  

## 2010-10-22 NOTE — Telephone Encounter (Signed)
Pt called and states that since the increase of his DM meds that his his FBS is averaging between 142-145. Please advise if anything else needs to be done for pt.

## 2010-10-22 NOTE — Telephone Encounter (Signed)
Continue with same medicines for now. In addition to increasing his medication, he also needs to be very concious about diet and exercise daily. We'll check a hemoglobin A1c when he comes back

## 2010-11-02 ENCOUNTER — Ambulatory Visit (INDEPENDENT_AMBULATORY_CARE_PROVIDER_SITE_OTHER): Payer: BC Managed Care – PPO | Admitting: Internal Medicine

## 2010-11-02 ENCOUNTER — Encounter: Payer: Self-pay | Admitting: Internal Medicine

## 2010-11-02 DIAGNOSIS — R229 Localized swelling, mass and lump, unspecified: Secondary | ICD-10-CM

## 2010-11-02 DIAGNOSIS — L989 Disorder of the skin and subcutaneous tissue, unspecified: Secondary | ICD-10-CM

## 2010-11-02 MED ORDER — METFORMIN HCL 1000 MG PO TABS: ORAL_TABLET | ORAL | Status: DC

## 2010-11-02 NOTE — Progress Notes (Signed)
  Subjective:    Patient ID: Samuel Meyer, male    DOB: [birth year], 60 y.o.   MRN: [id number]  HPI 2 weeks ago noted puffiness at the  left armpit. Since then the area is essentially unchanged, slightly less noticeable?. Needs his colonoscopy rescheduled Has a skin lesion on the chest for years. Occasionally he sees a blister there. History of skin cancer. Past Medical History  Diagnosis Date  . Diabetes mellitus 2/09  . Sleep apnea     no treatment  . Psoriatic arthritis     [facility] started on methotrexate 03/12/09, switch to enbrel aprox 2/11  . S/P cardiac cath   . Skin cancer     remotely, 4th L finger, not melanoma  . Testosterone deficiency   . Hyperlipidemia   . Hypertension   . Kidney stone    Past Surgical History  Procedure Date  . Appendectomy   . Tonsillectomy      Review of Systems Denies pain in the armpit. No redness or discharge.    Objective:   Physical Exam  Constitutional: He appears well-developed and well-nourished.  Musculoskeletal:       Right armpit normal Left armpit : looks puffier to inspection, on palpation it seems fuller without discrete mass or lymphadenopathy.  Skin:             Assessment & Plan:  Armpit mass? No discrete mass or lymphadenopathy, to be sure we agreed to do a  ultrasound. We'll contact GI for reschedule of his  colonoscopy Skin lesion: Recommend to see his dermatologist Dr Park Liter, pt states he will.

## 2010-11-03 ENCOUNTER — Telehealth: Payer: Self-pay | Admitting: Internal Medicine

## 2010-11-03 DIAGNOSIS — IMO0002 Reserved for concepts with insufficient information to code with codable children: Secondary | ICD-10-CM

## 2010-11-03 NOTE — Telephone Encounter (Signed)
Please arrange for the following: 1. Ultrasound of the left  axila---dx mass, rule out lymphadenopathy. 2. Contact GI, he likes to reschedule his colonoscopy

## 2010-11-03 NOTE — Telephone Encounter (Signed)
Placed order, pt will call to reschedule his colonscopy.

## 2010-11-04 ENCOUNTER — Telehealth: Payer: Self-pay | Admitting: Internal Medicine

## 2010-11-04 ENCOUNTER — Other Ambulatory Visit: Payer: Self-pay | Admitting: Internal Medicine

## 2010-11-04 DIAGNOSIS — IMO0002 Reserved for concepts with insufficient information to code with codable children: Secondary | ICD-10-CM

## 2010-11-04 DIAGNOSIS — R2230 Localized swelling, mass and lump, unspecified upper limb: Secondary | ICD-10-CM

## 2010-11-04 NOTE — Telephone Encounter (Signed)
In reference to the order Samuel Meyer entered for an [place] UPPER EXT ART LEFT, for patient's mass, per call from [facility], this is not a test they would do for patient's dx. They state this should just be a normal U/S at a regular Imaging Facility, which is anywhere but [facility].  Please advise.

## 2010-11-04 NOTE — Telephone Encounter (Addendum)
The location for U/S was changed, but the type of U/S is still the same which is incorrect.  Per [facility] this should be done at [facility].  Please remove original U/S.  Please enter Order for Unilateral MMG, and enter an Order for Unilateral U/S Breast.  They are asking for dx to be changed to Left Breast Mass Axilla.

## 2010-11-04 NOTE — Telephone Encounter (Signed)
Thx, anything I need to do?

## 2010-11-04 NOTE — Telephone Encounter (Signed)
Re entered, and changed location.

## 2010-11-04 NOTE — Telephone Encounter (Signed)
Done  KP

## 2010-11-05 ENCOUNTER — Encounter: Payer: Self-pay | Admitting: Internal Medicine

## 2010-11-10 ENCOUNTER — Other Ambulatory Visit: Payer: Self-pay | Admitting: Internal Medicine

## 2010-11-10 ENCOUNTER — Ambulatory Visit
Admission: RE | Admit: 2010-11-10 | Discharge: 2010-11-10 | Disposition: A | Discharge status: Home / Self Care | Payer: BC Managed Care – PPO | Source: Ambulatory Visit | Attending: Internal Medicine | Admitting: Internal Medicine

## 2010-11-10 DIAGNOSIS — R2230 Localized swelling, mass and lump, unspecified upper limb: Secondary | ICD-10-CM

## 2010-11-10 DIAGNOSIS — IMO0002 Reserved for concepts with insufficient information to code with codable children: Secondary | ICD-10-CM

## 2010-11-16 NOTE — Progress Notes (Signed)
Left message on machine about results. 

## 2011-01-01 ENCOUNTER — Other Ambulatory Visit: Payer: Self-pay | Admitting: Internal Medicine

## 2011-01-01 MED ORDER — METFORMIN HCL 1000 MG PO TABS: ORAL_TABLET | ORAL | Status: DC

## 2011-01-01 NOTE — Telephone Encounter (Signed)
Dose for Metformin was increased from 1 tab to 1 1/2  Twice a day - patient upset because new dose wasn't called in -  [facility] [address] --patient has an  appt  [phone] but doesn't have enough to get him through

## 2011-01-07 ENCOUNTER — Encounter: Payer: Self-pay | Admitting: Internal Medicine

## 2011-01-07 ENCOUNTER — Ambulatory Visit (INDEPENDENT_AMBULATORY_CARE_PROVIDER_SITE_OTHER): Payer: BC Managed Care – PPO | Admitting: Internal Medicine

## 2011-01-07 VITALS — BP 118/84 | HR 103 | Temp 97.6°F | Resp 16 | Wt 256.5 lb

## 2011-01-07 DIAGNOSIS — E291 Testicular hypofunction: Secondary | ICD-10-CM

## 2011-01-07 DIAGNOSIS — E119 Type 2 diabetes mellitus without complications: Secondary | ICD-10-CM

## 2011-01-07 DIAGNOSIS — Z23 Encounter for immunization: Secondary | ICD-10-CM

## 2011-01-07 NOTE — Assessment & Plan Note (Addendum)
Has been poorly controlled most of the time, patient aware of the situation, risks of uncontrolled diabetes discussed. I clearly stated that without diet and exercise it will require more medications to get him at goal. Also possible a endocrinology referral. Per chart review, Actos was discontinued because it wasn't effective according to the patient. Plan: Labs Januvia? Actos? Lantus? Victoza?

## 2011-01-07 NOTE — Progress Notes (Signed)
  Subjective:    Patient ID: Samuel Meyer, male    DOB: [birth year], 60 y.o.   MRN: [id number]  HPI Routine office visit Diabetes--has increased glyburide as recommended Skin lesions that she has not seen dermatology as recommended Colonoscopy-- has not get that scheduled, recommend to schedule it Hypogonadism--he talked to his rheumatologist and was told  okay to start testosterone. Office visit from the rheumatologist not available at this time   Past Medical History  Diagnosis Date  . Diabetes mellitus 2/09  . Sleep apnea     no treatment  . Psoriatic arthritis     [facility] started on methotrexate 03/12/09, switch to enbrel aprox 2/11  . S/P cardiac cath   . Skin cancer     remotely, 4th L finger, not melanoma  . Testosterone deficiency   . Hyperlipidemia   . Hypertension   . Kidney stone    Past Surgical History  Procedure Date  . Appendectomy   . Tonsillectomy     Review of Systems Not exercising any more than before Diet about the same, has gained 3 pounds Not taking his blood sugars    Objective:   Physical Exam  Constitutional: He appears well-developed.       Overweight appearing  Cardiovascular: Normal rate, regular rhythm and normal heart sounds.   No murmur heard. Pulmonary/Chest: Effort normal and breath sounds normal. No respiratory distress. He has no wheezes. He has no rales.          Assessment & Plan:  Today , I spent more than 25   min with the patient, >50% of the time counseling: we went over the A1Cs of the last few years, we discussed long term complications of DM

## 2011-01-07 NOTE — Assessment & Plan Note (Addendum)
Patient reports he was cleared to use testosterone by rheumatology In the past he saw Dr. Everardo All and Dr. Talmage Nap: Plan refer to endocrinology

## 2011-01-08 LAB — HEMOGLOBIN A1C: Hgb A1c MFr Bld: 7.9 % — ABNORMAL HIGH (ref 4.6–6.5)

## 2011-01-12 ENCOUNTER — Encounter: Payer: Self-pay | Admitting: Internal Medicine

## 2011-01-22 MED ORDER — SITAGLIPTIN PHOSPHATE 100 MG PO TABS: 100.0000 mg | ORAL_TABLET | Freq: Every day | ORAL | Status: DC

## 2011-01-22 NOTE — Progress Notes (Signed)
Addended by: Luisa Dago on: 01/22/2011 10:48 AM   Modules accepted: Orders

## 2011-01-24 ENCOUNTER — Other Ambulatory Visit: Payer: Self-pay | Admitting: Internal Medicine

## 2011-02-12 ENCOUNTER — Telehealth: Payer: Self-pay | Admitting: *Deleted

## 2011-02-12 DIAGNOSIS — L4050 Arthropathic psoriasis, unspecified: Secondary | ICD-10-CM

## 2011-02-12 DIAGNOSIS — T887XXA Unspecified adverse effect of drug or medicament, initial encounter: Secondary | ICD-10-CM

## 2011-02-12 NOTE — Telephone Encounter (Signed)
Faxed Rx for labs received - Verbal Ok given from Dr Drue Novel to order any labs that Rheumatology requests. Orders placed.

## 2011-02-12 NOTE — Telephone Encounter (Signed)
Caller states that patient needs to have labs done for Psoriatic Arthritis and will be faxing over prescription, due to Pt stating he would like to have labs done at our office.

## 2011-02-17 ENCOUNTER — Other Ambulatory Visit (INDEPENDENT_AMBULATORY_CARE_PROVIDER_SITE_OTHER): Payer: BC Managed Care – PPO

## 2011-02-17 DIAGNOSIS — L4050 Arthropathic psoriasis, unspecified: Secondary | ICD-10-CM

## 2011-02-17 DIAGNOSIS — T887XXA Unspecified adverse effect of drug or medicament, initial encounter: Secondary | ICD-10-CM

## 2011-02-17 LAB — CBC WITH DIFFERENTIAL/PLATELET
Basophils Absolute: 0 10*3/uL (ref 0.0–0.1)
Basophils Relative: 0.7 % (ref 0.0–3.0)
HCT: 47.1 % (ref 39.0–52.0)
Hemoglobin: 15.9 g/dL (ref 13.0–17.0)
Lymphs Abs: 2.1 10*3/uL (ref 0.7–4.0)
Monocytes Relative: 8.9 % (ref 3.0–12.0)
Neutro Abs: 4.2 10*3/uL (ref 1.4–7.7)
RDW: 13 % (ref 11.5–14.6)

## 2011-02-17 LAB — COMPREHENSIVE METABOLIC PANEL
ALT: 69 U/L — ABNORMAL HIGH (ref 0–53)
BUN: 13 mg/dL (ref 6–23)
CO2: 24 mEq/L (ref 19–32)
Creatinine, Ser: 1 mg/dL (ref 0.4–1.5)
GFR: 77.34 mL/min (ref >60.00)
Glucose, Bld: 105 mg/dL — ABNORMAL HIGH (ref 70–99)
Total Bilirubin: 0.6 mg/dL (ref 0.3–1.2)

## 2011-02-17 NOTE — Progress Notes (Signed)
Labs only

## 2011-02-19 ENCOUNTER — Telehealth: Payer: Self-pay

## 2011-02-19 NOTE — Telephone Encounter (Signed)
Labs faxed to [facility]: fax # [phone]

## 2011-02-19 NOTE — Telephone Encounter (Signed)
Message copied by Beverely Low on Fri Feb 19, 2011 11:06 AM ------      Message from: Willow Ora E      Created: Thu Feb 18, 2011  2:59 PM       Labs ordered by rheumatology, please fax to them

## 2011-02-24 ENCOUNTER — Telehealth: Payer: Self-pay

## 2011-02-24 NOTE — Telephone Encounter (Signed)
Pt c/o rash since starting januvia.  Per Dr. Drue Novel, d/c Alma Friendly and we will contact him with the change in medication  Pt aware and verbalized understanding. Medication added to allergy list Pt notes that he now sees Dr. Romero Belling, whom pt states is working on getting his DM under control

## 2011-02-25 NOTE — Telephone Encounter (Signed)
If he sees Dr. Talmage Nap (endocrinology), we'll let her take care of his diabetes.

## 2011-02-25 NOTE — Telephone Encounter (Signed)
Spoke with pt to advise that he should have Dr. Talmage Nap take of diabetes. Advised that I will give Dr. Willeen Cass office a call to let them know about the reaction to Januvia.  Left message to advise Dr. Willeen Cass nurse of pt's reaction to [facility]

## 2011-03-17 ENCOUNTER — Other Ambulatory Visit: Payer: Self-pay | Admitting: Internal Medicine

## 2011-04-01 ENCOUNTER — Other Ambulatory Visit (INDEPENDENT_AMBULATORY_CARE_PROVIDER_SITE_OTHER): Payer: BC Managed Care – PPO

## 2011-04-01 DIAGNOSIS — L4050 Arthropathic psoriasis, unspecified: Secondary | ICD-10-CM

## 2011-04-01 LAB — HEPATIC FUNCTION PANEL
AST: 33 U/L (ref 0–37)
Alkaline Phosphatase: 47 U/L (ref 39–117)
Bilirubin, Direct: 0 mg/dL (ref 0.0–0.3)
Total Bilirubin: 1 mg/dL (ref 0.3–1.2)

## 2011-04-10 ENCOUNTER — Other Ambulatory Visit: Payer: Self-pay | Admitting: Internal Medicine

## 2011-04-27 ENCOUNTER — Ambulatory Visit: Payer: BC Managed Care – PPO | Admitting: Internal Medicine

## 2011-04-27 ENCOUNTER — Other Ambulatory Visit (INDEPENDENT_AMBULATORY_CARE_PROVIDER_SITE_OTHER): Payer: BC Managed Care – PPO

## 2011-04-27 DIAGNOSIS — L4050 Arthropathic psoriasis, unspecified: Secondary | ICD-10-CM

## 2011-04-27 LAB — CBC WITH DIFFERENTIAL/PLATELET
Basophils Absolute: 0.1 10*3/uL (ref 0.0–0.1)
HCT: 50.1 % (ref 39.0–52.0)
Hemoglobin: 17.3 g/dL — ABNORMAL HIGH (ref 13.0–17.0)
Lymphs Abs: 1.9 10*3/uL (ref 0.7–4.0)
MCHC: 34.6 g/dL (ref 30.0–36.0)
MCV: 94.4 fl (ref 78.0–100.0)
Monocytes Absolute: 0.5 10*3/uL (ref 0.1–1.0)
Neutro Abs: 2.7 10*3/uL (ref 1.4–7.7)
Platelets: 207 10*3/uL (ref 150.0–400.0)
RDW: 13.1 % (ref 11.5–14.6)

## 2011-04-27 LAB — COMPREHENSIVE METABOLIC PANEL
ALT: 64 U/L — ABNORMAL HIGH (ref 0–53)
Alkaline Phosphatase: 44 U/L (ref 39–117)
Creatinine, Ser: 0.9 mg/dL (ref 0.4–1.5)
GFR: 87.93 mL/min (ref >60.00)
Sodium: 137 mEq/L (ref 135–145)
Total Bilirubin: 0.7 mg/dL (ref 0.3–1.2)
Total Protein: 7.8 g/dL (ref 6.0–8.3)

## 2011-06-01 ENCOUNTER — Other Ambulatory Visit: Payer: BC Managed Care – PPO

## 2011-06-05 ENCOUNTER — Other Ambulatory Visit: Payer: Self-pay | Admitting: Internal Medicine

## 2011-06-06 NOTE — Telephone Encounter (Signed)
Ok 90 days, has an appointment w/ me in few days

## 2011-06-07 NOTE — Telephone Encounter (Signed)
Refill done.  

## 2011-06-14 ENCOUNTER — Ambulatory Visit (INDEPENDENT_AMBULATORY_CARE_PROVIDER_SITE_OTHER): Payer: BC Managed Care – PPO | Admitting: Internal Medicine

## 2011-06-14 DIAGNOSIS — E291 Testicular hypofunction: Secondary | ICD-10-CM

## 2011-06-14 DIAGNOSIS — E119 Type 2 diabetes mellitus without complications: Secondary | ICD-10-CM

## 2011-06-14 DIAGNOSIS — Z23 Encounter for immunization: Secondary | ICD-10-CM

## 2011-06-14 DIAGNOSIS — E559 Vitamin D deficiency, unspecified: Secondary | ICD-10-CM

## 2011-06-14 DIAGNOSIS — I10 Essential (primary) hypertension: Secondary | ICD-10-CM

## 2011-06-14 DIAGNOSIS — R7989 Other specified abnormal findings of blood chemistry: Secondary | ICD-10-CM

## 2011-06-14 DIAGNOSIS — R945 Abnormal results of liver function studies: Secondary | ICD-10-CM

## 2011-06-14 DIAGNOSIS — Z0000 Encounter for general adult medical examination without abnormal findings: Secondary | ICD-10-CM | POA: Insufficient documentation

## 2011-06-14 DIAGNOSIS — E785 Hyperlipidemia, unspecified: Secondary | ICD-10-CM

## 2011-06-14 DIAGNOSIS — D518 Other vitamin B12 deficiency anemias: Secondary | ICD-10-CM

## 2011-06-14 DIAGNOSIS — R948 Abnormal results of function studies of other organs and systems: Secondary | ICD-10-CM

## 2011-06-14 LAB — LIPID PANEL
Cholesterol: 184 mg/dL (ref 0–200)
HDL: 38.7 mg/dL — ABNORMAL LOW (ref >39.00)
Total CHOL/HDL Ratio: 5
VLDL: 40.8 mg/dL — ABNORMAL HIGH (ref 0.0–40.0)

## 2011-06-14 LAB — FERRITIN: Ferritin: 39.5 ng/mL (ref 22.0–322.0)

## 2011-06-14 LAB — FOLATE: Folate: 9.1 ng/mL (ref >5.9)

## 2011-06-14 LAB — PSA: PSA: 0.65 ng/mL (ref 0.10–4.00)

## 2011-06-14 LAB — TSH: TSH: 1.42 u[IU]/mL (ref 0.35–5.50)

## 2011-06-14 MED ORDER — GLIMEPIRIDE 4 MG PO TABS: 4.0000 mg | ORAL_TABLET | Freq: Every day | ORAL | Status: DC

## 2011-06-14 MED ORDER — GLUCOSE BLOOD VI STRP: ORAL_STRIP | Status: DC

## 2011-06-14 MED ORDER — LECITHIN 1200 MG PO CAPS: 1200.0000 mg | ORAL_CAPSULE | Freq: Two times a day (BID) | ORAL | Status: DC

## 2011-06-14 MED ORDER — METFORMIN HCL 1000 MG PO TABS: ORAL_TABLET | ORAL | Status: DC

## 2011-06-14 MED ORDER — OMEGA-3-ACID ETHYL ESTERS 1 G PO CAPS: 1.0000 g | ORAL_CAPSULE | Freq: Two times a day (BID) | ORAL | Status: DC

## 2011-06-14 MED ORDER — OLMESARTAN MEDOXOMIL 40 MG PO TABS: 40.0000 mg | ORAL_TABLET | Freq: Every day | ORAL | Status: DC

## 2011-06-14 NOTE — Assessment & Plan Note (Signed)
Labs

## 2011-06-14 NOTE — Assessment & Plan Note (Signed)
BP elevated today but usually okay, see history of present illness. We were going to continue monitoring at home. See instructions

## 2011-06-14 NOTE — Assessment & Plan Note (Signed)
TD 8- 2010 Pneumonia shot today never had a colonoscopy, was referred last year but did not get one; reluctant to get one , benefits discussed, will call when ready, iFOB provided  Diet-exercise discussed Labs

## 2011-06-14 NOTE — Assessment & Plan Note (Signed)
Per Dr Balan 

## 2011-06-14 NOTE — Assessment & Plan Note (Addendum)
H/o increeased LFTs  Patient does not drink, related to Enbrel ? LFTs ok until 1-11 aound the time the patient started enbrel  Hepatitis B C  serology  (-) 08/2010 Plan: Iron, ferritin, ceruloplasmin. Recommend to discuss with the doctor prescribing Enbrel

## 2011-06-14 NOTE — Assessment & Plan Note (Addendum)
Labs , not on supplements

## 2011-06-14 NOTE — Patient Instructions (Signed)
Discuss your elevated liver test results with the doctor prescribing enbrel Check the  blood pressure 2 or 3 times a week, be sure it is less than 140/85. If it is consistently higher, let me know

## 2011-06-14 NOTE — Progress Notes (Signed)
  Subjective:    Patient ID: Samuel Meyer, male    DOB: 1950/04/24, 61 y.o.   MRN: [id number]  HPI Complete physical exam Diabetes he is currently followed by endocrinology, A1c has dropped from 7.9----> 7.3. His lifestyle is about the same, I did noted some weight gain. He started bydureon 3 weeks ago. BP noted to be elevated today, he takes ambulatory blood pressures: ~ 120/80, he remembers that when he went to see Dr. Talmage Nap his BP was 138/82 Also pulse ~ 100, pt states that is his baseline, we are checking a TSH  Past Medical History: AODM dx 2-09 aprox h/o SLEEP APNEA remotely, no treatment psoriatic  arthritis -- seen at [facility] . 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   Cardiovascular: Negative for chest pain and leg swelling.  Gastrointestinal: Negative for abdominal pain and blood in stool.  Genitourinary: Negative for dysuria and hematuria.       Objective:   Physical Exam  General:  alert, well-developed, and overweight-appearing.   Neck:  no thyromegaly, and normal carotid upstroke.   Lungs:  normal respiratory effort, no intercostal retractions, no accessory muscle use, and normal breath sounds.   Heart:  normal rate,  regular rhythm, no murmur, and no gallop.   Abdomen:  soft, non-tender, no distention, no masses, no guarding, and no rigidity.  No bruit Rectal:  No external abnormalities noted. Normal sphincter tone. No rectal masses or tenderness. hem neg  Prostate:  Prostate gland firm and smooth, no enlargement, nodularity, tenderness, mass, asymmetry or induration. Psych:  Cognition and judgment appear intact. Alert and cooperative with normal attention span and concentration  DIABETIC FEET EXAM: No lower extremity edema Normal pedal pulses bilaterally Skin and nails are normal without calluses Pinprick examination of the feet normal.      Assessment & Plan:

## 2011-06-14 NOTE — Assessment & Plan Note (Addendum)
Labs , on shots

## 2011-06-15 ENCOUNTER — Encounter: Payer: Self-pay | Admitting: Internal Medicine

## 2011-06-15 LAB — PSA: PSA: 0.6 ng/mL (ref <=4.00)

## 2011-06-15 LAB — CERULOPLASMIN: Ceruloplasmin: 19 mg/dL — ABNORMAL LOW (ref 20–60)

## 2011-06-21 ENCOUNTER — Encounter: Payer: Self-pay | Admitting: *Deleted

## 2011-06-21 MED ORDER — ERGOCALCIFEROL 1.25 MG (50000 UT) PO CAPS: 50000.0000 [IU] | ORAL_CAPSULE | ORAL | Status: DC

## 2011-07-03 ENCOUNTER — Other Ambulatory Visit: Payer: Self-pay | Admitting: Internal Medicine

## 2011-07-05 NOTE — Telephone Encounter (Signed)
Spoke with Zollie Scale at the pharmacy & she confirmed that there are refills at the pharmacy.

## 2011-07-07 ENCOUNTER — Other Ambulatory Visit: Payer: Self-pay | Admitting: *Deleted

## 2011-07-07 MED ORDER — GLIMEPIRIDE 4 MG PO TABS: 4.0000 mg | ORAL_TABLET | Freq: Every day | ORAL | Status: DC

## 2011-07-07 MED ORDER — METFORMIN HCL 1000 MG PO TABS: ORAL_TABLET | ORAL | Status: DC

## 2011-07-07 MED ORDER — OLMESARTAN MEDOXOMIL 40 MG PO TABS: 40.0000 mg | ORAL_TABLET | Freq: Every day | ORAL | Status: DC

## 2011-07-07 NOTE — Telephone Encounter (Signed)
Done

## 2011-07-12 ENCOUNTER — Telehealth: Payer: Self-pay | Admitting: *Deleted

## 2011-07-12 MED ORDER — GLIMEPIRIDE 4 MG PO TABS: ORAL_TABLET | ORAL | Status: DC

## 2011-07-12 NOTE — Telephone Encounter (Signed)
Pt called stating he takes glimipiride 1.5tablets daily. Resent rx.

## 2011-07-14 ENCOUNTER — Ambulatory Visit (INDEPENDENT_AMBULATORY_CARE_PROVIDER_SITE_OTHER): Payer: BC Managed Care – PPO | Admitting: Internal Medicine

## 2011-07-14 ENCOUNTER — Encounter: Payer: Self-pay | Admitting: Internal Medicine

## 2011-07-14 VITALS — BP 124/90 | HR 112 | Temp 98.4°F | Wt 262.0 lb

## 2011-07-14 DIAGNOSIS — R000 Tachycardia, unspecified: Secondary | ICD-10-CM

## 2011-07-14 DIAGNOSIS — R059 Cough, unspecified: Secondary | ICD-10-CM

## 2011-07-14 DIAGNOSIS — R05 Cough: Secondary | ICD-10-CM

## 2011-07-14 DIAGNOSIS — J019 Acute sinusitis, unspecified: Secondary | ICD-10-CM

## 2011-07-14 MED ORDER — AMOXICILLIN-POT CLAVULANATE 875-125 MG PO TABS: 1.0000 | ORAL_TABLET | Freq: Two times a day (BID) | ORAL | Status: AC

## 2011-07-14 MED ORDER — HYDROCODONE-HOMATROPINE 5-1.5 MG/5ML PO SYRP: 5.0000 mL | ORAL_SOLUTION | Freq: Four times a day (QID) | ORAL | Status: AC | PRN

## 2011-07-14 NOTE — Patient Instructions (Signed)
Plain Mucinex for thick secretions ;force NON dairy fluids. Use a Neti pot daily as needed for sinus congestion. Nasal cleansing in the shower as discussed. Make sure that all residual soap is removed to prevent irritation.  Please try to go on My Chart within the next 24 hours to allow me to release the results directly to you.  To prevent tacycardia, avoid stimulants such as decongestants, diet pills, nicotine, or caffeine (coffee, tea, cola, or chocolate) to excess.

## 2011-07-14 NOTE — Progress Notes (Signed)
  Subjective:    Patient ID: Samuel Meyer, male    DOB: [birth year], 61 y.o.   MRN: [id number]  HPI Respiratory tract infection Onset/symptoms:07/07/11 as chest & head congestion Exposures (illness/environmental/extrinsic):? Progression of symptoms:improved by 3/28 but more severe head congestion Treatments/response:Robitussin CF, Tylenol with some benefit Present symptoms: Fever/chills/sweats:has felt warm & clammy Frontal headache:yes Facial pain:yes Nasal purulence:yes Sore throat:not now Dental pain:no Lymphadenopathy:no Wheezing/shortness of breath:wheezing & SOB impair sleep X 4 days Cough/sputum/hemoptysis:scant clear plugs Pleuritic pain:no Associated extrinsic/allergic symptoms:itchy eyes/ sneezing:no Past medical history: Seasonal allergies: yes/asthma:no Smoking history:quit 1980           Review of Systems He is on Enbrel for psoriatic arthritis every 6 mos; last injection was 2 weeks ago. Fasting blood sugars have been 160-180. His A1c was 7.2% last month; he sees Dr Talmage Nap. He denies any hypoglycemia. He denies any significant reflux symptoms. He is not on ACE inhibitor  He denies chest pain or palpitations. He believes the over-the-counter medicines taken have decongestants in them. His TSH was therapeutic at 1.4-13/for/13. AST and ALT were mildly elevated but stable. His last CBC was in February and electrolytes in January.     Objective:   Physical Exam General appearance:good health ;well nourished; no acute distress or increased work of breathing is present.  No  lymphadenopathy about the head, neck, or axilla noted.   Eyes: No conjunctival inflammation or lid edema is present. There is no scleral icterus.  Ears:  External ear exam shows no significant lesions or deformities.  Otoscopic examination reveals some wax but  tympanic membranes are intact bilaterally without bulging, retraction, inflammation or discharge.  Nose:  External nasal examination shows  no deformity or inflammation. Nasal mucosa are dry without lesions or exudates. No septal dislocation or deviation.No obstruction to airflow.   Oral exam: Dental hygiene is good; lips and gums are healthy appearing.There is no oropharyngeal erythema or exudate noted.   Neck:  No deformities, thyromegaly, masses, or tenderness noted.   Supple with full range of motion without pain.   Heart:  Rapid  rate and regular rhythm. S1 and S2 normal without gallop, murmur, click, rub . S 4 present Lungs: Has intermittent dry cough; there are minimal rales at the right base. No definite rhonchi or wheezes are present at this time. No increased work of breathing.    Extremities:  No cyanosis, edema, or clubbing  noted    Skin: Warm & dry w/o jaundice or tenting. Minor psoriatic elbow changes          Assessment & Plan:  #1 sinusitis with purulent secretions  #2 cough, essentially nonproductive  #3 tachycardia, asymptomatic. Most likely related to decongestants  #4 diabetes control improved but A1c still elevated  #5 Enbrel therapy for psoriatic arthritis  Plan: See orders and recommendations

## 2011-07-15 LAB — CBC WITH DIFFERENTIAL/PLATELET
Basophils Absolute: 0.1 10*3/uL (ref 0.0–0.1)
Basophils Relative: 1 % (ref 0.0–3.0)
Eosinophils Absolute: 0.1 10*3/uL (ref 0.0–0.7)
Lymphocytes Relative: 41.3 % (ref 12.0–46.0)
MCHC: 33.1 g/dL (ref 30.0–36.0)
MCV: 94 fl (ref 78.0–100.0)
Monocytes Absolute: 0.8 10*3/uL (ref 0.1–1.0)
Neutrophils Relative %: 41.8 % — ABNORMAL LOW (ref 43.0–77.0)
Platelets: 228 10*3/uL (ref 150.0–400.0)
RBC: 5.68 Mil/uL (ref 4.22–5.81)
RDW: 13.6 % (ref 11.5–14.6)

## 2011-07-23 ENCOUNTER — Other Ambulatory Visit: Payer: Self-pay | Admitting: *Deleted

## 2011-07-23 NOTE — Telephone Encounter (Signed)
Pt called stating that he received a phone call from [facility] saying they will no longer cover his glucose blood test strips that he has been using & the only ones they will cover is [employer]. OK to fill rx?

## 2011-07-26 MED ORDER — GLUCOSE BLOOD VI DISK: DISK | Status: DC

## 2011-07-26 NOTE — Telephone Encounter (Signed)
Ok to RF whatever brand of strips he requests

## 2011-07-26 NOTE — Telephone Encounter (Signed)
Refill done.  

## 2011-07-28 ENCOUNTER — Other Ambulatory Visit: Payer: BC Managed Care – PPO

## 2011-07-29 ENCOUNTER — Encounter: Payer: Self-pay | Admitting: Internal Medicine

## 2011-07-29 DIAGNOSIS — Z1211 Encounter for screening for malignant neoplasm of colon: Secondary | ICD-10-CM

## 2011-12-14 ENCOUNTER — Ambulatory Visit: Payer: BC Managed Care – PPO | Admitting: Internal Medicine

## 2012-01-04 ENCOUNTER — Ambulatory Visit (INDEPENDENT_AMBULATORY_CARE_PROVIDER_SITE_OTHER): Payer: BC Managed Care – PPO | Admitting: Internal Medicine

## 2012-01-04 ENCOUNTER — Encounter: Payer: Self-pay | Admitting: Internal Medicine

## 2012-01-04 VITALS — BP 138/82 | HR 84 | Temp 98.2°F | Wt 257.0 lb

## 2012-01-04 DIAGNOSIS — I10 Essential (primary) hypertension: Secondary | ICD-10-CM

## 2012-01-04 DIAGNOSIS — R948 Abnormal results of function studies of other organs and systems: Secondary | ICD-10-CM

## 2012-01-04 DIAGNOSIS — E785 Hyperlipidemia, unspecified: Secondary | ICD-10-CM

## 2012-01-04 DIAGNOSIS — L408 Other psoriasis: Secondary | ICD-10-CM

## 2012-01-04 DIAGNOSIS — Z85828 Personal history of other malignant neoplasm of skin: Secondary | ICD-10-CM

## 2012-01-04 DIAGNOSIS — D751 Secondary polycythemia: Secondary | ICD-10-CM | POA: Insufficient documentation

## 2012-01-04 DIAGNOSIS — D45 Polycythemia vera: Secondary | ICD-10-CM

## 2012-01-04 DIAGNOSIS — E559 Vitamin D deficiency, unspecified: Secondary | ICD-10-CM

## 2012-01-04 NOTE — Assessment & Plan Note (Signed)
On Enbrel, closely f/u by rheumatology

## 2012-01-04 NOTE — Assessment & Plan Note (Signed)
Status post ergocalciferol. Recommend to start over-the-counter vitamin D Labs

## 2012-01-04 NOTE — Assessment & Plan Note (Signed)
Well controlled 

## 2012-01-04 NOTE — Assessment & Plan Note (Addendum)
Since the last time he was here, iron, ferritin and ceruloplasmin were unremarkable. Temporarily, increased LFTs is related to enbrel.

## 2012-01-04 NOTE — Assessment & Plan Note (Signed)
Mild, chronic polycythemia, currently stable. The patient reports a sleep study in the past which was positive, has never used a CPAP. Currently admits only to snoring but no fatigue or feeling sleepy. Plan: Observation

## 2012-01-04 NOTE — Progress Notes (Signed)
  Subjective:    Patient ID: Samuel Meyer, male    DOB: [birth year], 61 y.o.   MRN: [id number]  HPI Routine office visit, in general doing well. Reports a skin lesion for the last few weeks, it bled initially and now is not healing. Denies itching. Status post vitamin D supplementation  Past Medical History: AODM dx 2-09 aprox h/o SLEEP APNEA remotely, no treatment psoriatic  arthritis -- seen at [facility] . 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 Diet is healthy, unable to exercise as much as he likes to. Medications reviewed and updated. Request a flu shot. Last hemoglobin was a slightly elevated, he admits to snoring, denies fatigue or feeling sleepy throughout the day.     Objective:   Physical Exam  Skin:       General -- alert, well-developed, and overweight appearing. No apparent distress.  Lungs -- normal respiratory effort, no intercostal retractions, no accessory muscle use, and normal breath sounds.   Heart-- normal rate, regular rhythm, no murmur, and no gallop.   Extremities-- no pretibial edema bilaterally  Psych-- Cognition and judgment appear intact. Alert and cooperative with normal attention span and concentration.  not anxious appearing and not depressed appearing.          Assessment & Plan:

## 2012-01-04 NOTE — Assessment & Plan Note (Addendum)
Last  LDL slightly above 100, he already has a healthy diet, encourage more exercise. Due to increased LFTs i'm  reluctant to prescribe a statin, we could try Zetia at some point. We discussed this issue, elected to work on increase his physical activity

## 2012-01-04 NOTE — Assessment & Plan Note (Signed)
Has a skin lesion at the back for several weeks, recommend dermatology referral. Reports he will make his own appointment with Dr. Park Liter

## 2012-01-05 ENCOUNTER — Telehealth: Payer: Self-pay | Admitting: Internal Medicine

## 2012-01-05 LAB — VITAMIN D 25 HYDROXY (VIT D DEFICIENCY, FRACTURES): Vit D, 25-Hydroxy: 42 ng/mL (ref 30–89)

## 2012-01-05 NOTE — Telephone Encounter (Signed)
dos 9.3.13-no show fee waiver - per PCP and per [facility] pt cancelled so 4-got to do-pt was no showed and we waived

## 2012-01-17 DIAGNOSIS — L4050 Arthropathic psoriasis, unspecified: Secondary | ICD-10-CM | POA: Insufficient documentation

## 2012-02-18 ENCOUNTER — Telehealth: Payer: Self-pay | Admitting: *Deleted

## 2012-02-18 MED ORDER — KETOCONAZOLE 2 % EX CREA: TOPICAL_CREAM | Freq: Two times a day (BID) | CUTANEOUS | Status: DC

## 2012-02-18 NOTE — Telephone Encounter (Signed)
Call ketoconazole 2% cream, apply bid x 10 days, #1 tube; if no better or worsening: needs OV

## 2012-02-18 NOTE — Telephone Encounter (Signed)
Pt states that he has had a ringworm on Left inner ankle for the past 2 weeks. Pt indicated that he has been applying clear nail polish as instructed per the Internet but has not seen any improvement.Pt notes that he has not tried any other treatment for ringworm. Pt uses [facility]. Please advise

## 2012-02-18 NOTE — Telephone Encounter (Signed)
Discuss with patient, Rx sent to pharmacy. 

## 2012-02-18 NOTE — Telephone Encounter (Signed)
Return Pt call left message to have Pt call office.

## 2012-03-13 ENCOUNTER — Telehealth: Payer: Self-pay | Admitting: Internal Medicine

## 2012-03-13 MED ORDER — OMEGA-3-ACID ETHYL ESTERS 1 G PO CAPS: 1.0000 g | ORAL_CAPSULE | Freq: Two times a day (BID) | ORAL | Status: DC

## 2012-03-13 NOTE — Telephone Encounter (Signed)
Refill: Lovaza capsule 1 gm. 90 day supply to [facility] Cyanocobalamin inj. Inj 1 ml im q month. 90 day supply Testosterone inj

## 2012-03-13 NOTE — Telephone Encounter (Signed)
lmovm for pt to return call to verify change in pharmacy.

## 2012-03-14 ENCOUNTER — Encounter: Payer: Self-pay | Admitting: Internal Medicine

## 2012-03-14 ENCOUNTER — Other Ambulatory Visit: Payer: Self-pay | Admitting: Internal Medicine

## 2012-03-14 MED ORDER — OLMESARTAN MEDOXOMIL 40 MG PO TABS: 40.0000 mg | ORAL_TABLET | Freq: Every day | ORAL | Status: DC

## 2012-03-14 MED ORDER — CYANOCOBALAMIN 1000 MCG/ML IJ SOLN: 1000.0000 ug | INTRAMUSCULAR | Status: DC

## 2012-03-14 MED ORDER — METFORMIN HCL 1000 MG PO TABS: ORAL_TABLET | ORAL | Status: DC

## 2012-03-14 MED ORDER — GLIMEPIRIDE 4 MG PO TABS: ORAL_TABLET | ORAL | Status: DC

## 2012-03-14 NOTE — Telephone Encounter (Signed)
Refills x 3 last ov 9.24.13  1-Benicar 40mg  tablet requesting 90-day supply--last wrt 3.27.13  2-Glimepiride tablet 4mg  requesting 90-day supply --last wrt 4.1.13  3-Metformin HCL tablet 1000MG  requesting 90-day supply--last wrt 3.27.13

## 2012-03-14 NOTE — Telephone Encounter (Signed)
[facility] 

## 2012-03-14 NOTE — Telephone Encounter (Signed)
Pt has been made aware. Other meds sent to [facility].

## 2012-03-14 NOTE — Telephone Encounter (Signed)
Testosterone needs to be refilled by Dr Talmage Nap, see chart Okay to send 90 day supply and 1 RF for --->  lovaza and cyanobalamin

## 2012-03-14 NOTE — Telephone Encounter (Signed)
Pt confirmed all meds be sent to [facility]. Dr. Drue Novel, please verify testosterone injection dosage.

## 2012-05-27 ENCOUNTER — Other Ambulatory Visit: Payer: Self-pay

## 2012-06-15 ENCOUNTER — Ambulatory Visit (INDEPENDENT_AMBULATORY_CARE_PROVIDER_SITE_OTHER): Payer: BC Managed Care – PPO | Admitting: Internal Medicine

## 2012-06-15 ENCOUNTER — Encounter: Payer: Self-pay | Admitting: Internal Medicine

## 2012-06-15 VITALS — BP 126/84 | HR 104 | Temp 97.7°F | Ht 73.5 in | Wt 254.0 lb

## 2012-06-15 DIAGNOSIS — D518 Other vitamin B12 deficiency anemias: Secondary | ICD-10-CM

## 2012-06-15 DIAGNOSIS — L408 Other psoriasis: Secondary | ICD-10-CM

## 2012-06-15 DIAGNOSIS — Z23 Encounter for immunization: Secondary | ICD-10-CM

## 2012-06-15 DIAGNOSIS — Z0000 Encounter for general adult medical examination without abnormal findings: Secondary | ICD-10-CM

## 2012-06-15 DIAGNOSIS — Z2911 Encounter for prophylactic immunotherapy for respiratory syncytial virus (RSV): Secondary | ICD-10-CM

## 2012-06-15 DIAGNOSIS — D751 Secondary polycythemia: Secondary | ICD-10-CM

## 2012-06-15 LAB — LIPID PANEL
HDL: 41.6 mg/dL (ref >39.00)
Total CHOL/HDL Ratio: 5
Triglycerides: 205 mg/dL — ABNORMAL HIGH (ref 0.0–149.0)
VLDL: 41 mg/dL — ABNORMAL HIGH (ref 0.0–40.0)

## 2012-06-15 LAB — COMPREHENSIVE METABOLIC PANEL
Albumin: 4.5 g/dL (ref 3.5–5.2)
BUN: 13 mg/dL (ref 6–23)
CO2: 23 mEq/L (ref 19–32)
Calcium: 9.5 mg/dL (ref 8.4–10.5)
Chloride: 101 mEq/L (ref 96–112)
Glucose, Bld: 153 mg/dL — ABNORMAL HIGH (ref 70–99)
Potassium: 4.4 mEq/L (ref 3.5–5.1)

## 2012-06-15 LAB — PSA: PSA: 0.41 ng/mL (ref 0.10–4.00)

## 2012-06-15 LAB — CBC WITH DIFFERENTIAL/PLATELET
Basophils Relative: 1 % (ref 0.0–3.0)
Eosinophils Absolute: 0.2 10*3/uL (ref 0.0–0.7)
Eosinophils Relative: 1.9 % (ref 0.0–5.0)
Lymphocytes Relative: 31.8 % (ref 12.0–46.0)
Neutrophils Relative %: 57.1 % (ref 43.0–77.0)
RBC: 5.06 Mil/uL (ref 4.22–5.81)
WBC: 7.8 10*3/uL (ref 4.5–10.5)

## 2012-06-15 LAB — VITAMIN B12: Vitamin B-12: 520 pg/mL (ref 211–911)

## 2012-06-15 MED ORDER — OMEGA-3-ACID ETHYL ESTERS 1 G PO CAPS: 1.0000 g | ORAL_CAPSULE | Freq: Two times a day (BID) | ORAL | Status: DC

## 2012-06-15 MED ORDER — OLMESARTAN MEDOXOMIL 40 MG PO TABS: 40.0000 mg | ORAL_TABLET | Freq: Every day | ORAL | Status: DC

## 2012-06-15 NOTE — Assessment & Plan Note (Addendum)
Not taking shots q month consistently, labs

## 2012-06-15 NOTE — Assessment & Plan Note (Signed)
Recheck a CBC, if hemoglobin still elevated will refer to hematology

## 2012-06-15 NOTE — Progress Notes (Signed)
  Subjective:    Patient ID: Samuel Meyer, male    DOB: 12/27/1950, 62 y.o.   MRN: [id number]  HPI CPX  Past Medical History: AODM dx 2-09 aprox h/o SLEEP APNEA remotely, no treatment psoriatic  arthritis -- seen at [facility] . 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 No chest pain or shortness or breath No nausea, vomiting, diarrhea. No blood in the stools. No dysuria gross nocturia.     Objective:   Physical Exam BP 126/84  Pulse 104  Temp(Src) 97.7 F (36.5 C) (Oral)  Ht 6' 1.5" (1.867 m)  Wt 254 lb (115.214 kg)  BMI 33.05 kg/m2  SpO2 95%  General -- alert, well-developed   Neck --no thyromegaly , normal carotid pulse Lungs -- normal respiratory effort, no intercostal retractions, no accessory muscle use, and normal breath sounds.   Heart-- normal rate, regular rhythm, no murmur, and no gallop.   Abdomen--soft, non-tender, no distention, no masses, no HSM, no guarding, and no rigidity.   Extremities-- no pretibial edema bilaterally Rectal-- No external abnormalities noted. Normal sphincter tone. No rectal masses or tenderness. Brown stool, Hemoccult  negative Prostate:  Prostate gland firm and smooth, no enlargement, nodularity, tenderness, mass, asymmetry or induration. Neurologic-- alert & oriented X3 and strength normal in all extremities. Psych-- Cognition and judgment appear intact. Alert and cooperative with normal attention span and concentration.  not anxious appearing and not depressed appearing.       Assessment & Plan:

## 2012-06-15 NOTE — Assessment & Plan Note (Signed)
His rheumatologist is in [place]. Reports that his LFTs were rechecked few months ago and they were normal.

## 2012-06-15 NOTE — Assessment & Plan Note (Addendum)
TD 8- 2010 Pneumonia shot today zostavax discussed provided one today never had a colonoscopy,this year he accepted to be referred to GI Diet-exercise discussed Labs  Addendum: after the pt left, I realized he takes embrel thus a Zostavax was NOT indicated, called ID, they advise that his chance to get a reaction are still very low (I wonser about  Rx Valtrex but that has not been a usual practice). I called the patient, aware that I should have not Rx zostavax, apologized to him.Asked to call me if he has a rash-fever-N-V-HA.

## 2012-06-19 ENCOUNTER — Telehealth: Payer: Self-pay | Admitting: *Deleted

## 2012-06-19 DIAGNOSIS — K7689 Other specified diseases of liver: Secondary | ICD-10-CM

## 2012-06-19 MED ORDER — ERGOCALCIFEROL 1.25 MG (50000 UT) PO CAPS: 50000.0000 [IU] | ORAL_CAPSULE | ORAL | Status: DC

## 2012-06-19 NOTE — Telephone Encounter (Signed)
Message copied by Verdene Rio on Mon Jun 19, 2012  4:26 PM ------      Message from: Wanda Plump      Created: Sat Jun 17, 2012  9:16 AM      Regarding: please see instructions       -- arrange a GI referral, dx increased LFTs      --Call  Ergocalciferol 50,000 units, 1 po q week x 3 months, #12, no RF      --Fax the recent labs to pt's  endocrinologist and dermatologist        ------

## 2012-06-19 NOTE — Telephone Encounter (Signed)
Rx sent, labs faxed, referral placed

## 2012-06-21 ENCOUNTER — Encounter: Payer: Self-pay | Admitting: Internal Medicine

## 2012-07-04 ENCOUNTER — Encounter: Payer: Self-pay | Admitting: Internal Medicine

## 2012-07-06 ENCOUNTER — Ambulatory Visit: Payer: BC Managed Care – PPO | Admitting: Internal Medicine

## 2012-07-11 ENCOUNTER — Encounter: Payer: Self-pay | Admitting: Internal Medicine

## 2012-07-12 ENCOUNTER — Encounter: Payer: Self-pay | Admitting: Internal Medicine

## 2012-07-12 ENCOUNTER — Ambulatory Visit (INDEPENDENT_AMBULATORY_CARE_PROVIDER_SITE_OTHER): Payer: BC Managed Care – PPO | Admitting: Internal Medicine

## 2012-07-12 ENCOUNTER — Ambulatory Visit (INDEPENDENT_AMBULATORY_CARE_PROVIDER_SITE_OTHER): Payer: BC Managed Care – PPO

## 2012-07-12 VITALS — BP 126/88 | HR 100 | Ht 73.5 in | Wt 257.0 lb

## 2012-07-12 DIAGNOSIS — Z1211 Encounter for screening for malignant neoplasm of colon: Secondary | ICD-10-CM

## 2012-07-12 DIAGNOSIS — R7989 Other specified abnormal findings of blood chemistry: Secondary | ICD-10-CM

## 2012-07-12 DIAGNOSIS — K760 Fatty (change of) liver, not elsewhere classified: Secondary | ICD-10-CM

## 2012-07-12 DIAGNOSIS — K7689 Other specified diseases of liver: Secondary | ICD-10-CM

## 2012-07-12 DIAGNOSIS — R1011 Right upper quadrant pain: Secondary | ICD-10-CM

## 2012-07-12 DIAGNOSIS — Z23 Encounter for immunization: Secondary | ICD-10-CM

## 2012-07-12 LAB — FERRITIN: Ferritin: 125.3 ng/mL (ref 22.0–322.0)

## 2012-07-12 NOTE — Patient Instructions (Addendum)
You have been scheduled for a colonoscopy with propofol. Please follow written instructions given to you at your visit today.  Please pick up your prep kit at the pharmacy within the next 1-3 days. If you use inhalers (even only as needed), please bring them with you on the day of your procedure.  You have been scheduled for an abdominal ultrasound at [facility] (1st floor of hospital) on 07/14/2012 at 9:30. Please arrive 15 minutes prior to your appointment for registration. Make certain not to have anything to eat or drink 6 hours prior to your appointment. Should you need to reschedule your appointment, please contact radiology at [phone]. This test typically takes about 30 minutes to perform./  Your physician has requested that you go to the basement for  lab work before leaving today   myalgias

## 2012-07-12 NOTE — Progress Notes (Signed)
Patient ID: Samuel Meyer, male   DOB: May 21, 1950, 62 y.o.   MRN: [id number] HPI: Samuel Meyer is Meyer 62 yo male with PMH of diabetes, sleep apnea, psoriatic arthritis on Enbrel, hyperlipidemia and hypertension who seen in consultation at the request of Dr. Drue Novel for evaluation of elevated liver enzymes and consideration of colonoscopy for screening. He is alone today. He reports he is feeling well. He is without specific complaints. He has never had Meyer colonoscopy, and reports he has done well in avoiding it to this point. He denies change in his bowel habits, blood in his stool or melena. No abdominal pain. No nausea or vomiting. No weight loss. Regarding his elevated liver numbers, he reports this was most noticeable after starting Enbrel approximately 3 years ago. He was previously on methotrexate, but this did not provide much benefit in this medication was stopped. He took this for Meyer short time and reports he did not stop this due to elevated liver enzymes.  He denies Meyer past history of jaundice, ascites, lower extremity edema, itching.  No history of GI bleeding. He does not currently drink alcohol and hasn't for many many years.  Past Medical History  Diagnosis Date  . Diabetes mellitus 2/09  . Sleep apnea     no treatment  . Psoriatic arthritis     [facility] started on methotrexate 03/12/09, switch to enbrel aprox 2/11  . S/P cardiac cath   . Skin cancer     remotely, 4th L finger, not melanoma  . Testosterone deficiency   . Hyperlipidemia   . Hypertension   . Kidney stone     Past Surgical History  Procedure Laterality Date  . Appendectomy    . Tonsillectomy      Current Outpatient Prescriptions  Medication Sig Dispense Refill  . cyanocobalamin (,VITAMIN B-12,) 1000 MCG/ML injection Inject 1 mL (1,000 mcg total) into the muscle every 30 (thirty) days.  10 mL  0  . ergocalciferol (VITAMIN D2) 50000 UNITS capsule Take 1 capsule (50,000 Units total) by mouth once Meyer week.  12 capsule  0  .  etanercept (ENBREL) 50 MG/ML injection Inject 50 mg into the skin once Meyer week.        Judie Petit Prim-Borage (FLAX OIL XTRA PO) Take by mouth.        Marland Kitchen glimepiride (AMARYL) 4 MG tablet Take 4 mg by mouth 2 (two) times daily.  180 tablet  1  . glucose blood (ACCU-CHEK AVIVA) test strip Use as instructed  100 each  2  . Glucose Blood (BAYER BREEZE 2 TEST) DISK Use as directed.  100 each  1  . Lecithin 1200 MG CAPS Take 1 capsule (1,200 mg total) by mouth 2 (two) times daily. PRN Only.  60 capsule  11  . metFORMIN (GLUCOPHAGE) 1000 MG tablet TAKE 1.5 TABLET BY MOUTH TWICE Meyer DAY  270 tablet  1  . olmesartan (BENICAR) 40 MG tablet Take 1 tablet (40 mg total) by mouth daily.  90 tablet  3  . omega-3 acid ethyl esters (LOVAZA) 1 G capsule Take 1 capsule (1 g total) by mouth 2 (two) times daily.  180 capsule  3   No current facility-administered medications for this visit.    Allergies  Allergen Reactions  . Januvia (Sitagliptin Phosphate) Rash    Rash   . Statins     Family History  Problem Relation Age of Onset  . Stroke Father     cerebral hemorrhage, stroke x  2  . Hypertension Mother   . Glaucoma      siblings  . Asthma      GM  . Lung cancer      GM  . Prostate cancer Neg Hx   . Colon cancer Neg Hx   . Heart attack Father   . Diabetes Father     History   Social History  . Marital Status: Married    Spouse Name: N/Meyer    Number of Children: N/Meyer  . Years of Education: N/Meyer   Social History Main Topics  . Smoking status: Former Smoker    Quit date: 04/12/1978  . Smokeless tobacco: None  . Alcohol Use: No  . Drug Use: No  . Sexually Active: None   Other Topics Concern  . None   Social History Narrative  . None    ROS: As per history of present illness, otherwise negative  BP 126/88  Pulse 100  Ht 6' 1.5" (1.867 m)  Wt 257 lb (116.574 kg)  BMI 33.44 kg/m2 Constitutional: Well-developed and well-nourished. No distress. HEENT: Normocephalic and  atraumatic. Oropharynx is clear and moist. No oropharyngeal exudate. Conjunctivae are normal.  No scleral icterus. Neck: Neck supple. Trachea midline. Cardiovascular: Normal rate, regular rhythm and intact distal pulses. No M/R/G Pulmonary/chest: Effort normal and breath sounds normal. No wheezing, rales or rhonchi. Abdominal: Soft, nontender, nondistended. Bowel sounds active throughout. There are no masses palpable. No hepatosplenomegaly. Extremities: no clubbing, cyanosis, or edema Lymphadenopathy: No cervical adenopathy noted. Neurological: Alert and oriented to person place and time. Skin: Skin is warm and dry. No rashes noted.  Very mild plaques elbows only Psychiatric: Normal mood and affect. Behavior is normal.  RELEVANT LABS AND IMAGING: CBC    Component Value Date/Time   WBC 7.8 06/15/2012 1116   RBC 5.06 06/15/2012 1116   HGB 15.7 06/15/2012 1116   HCT 47.4 06/15/2012 1116   PLT 256.0 06/15/2012 1116   MCV 93.7 06/15/2012 1116   MCHC 33.1 06/15/2012 1116   RDW 13.1 06/15/2012 1116   LYMPHSABS 2.5 06/15/2012 1116   MONOABS 0.6 06/15/2012 1116   EOSABS 0.2 06/15/2012 1116   BASOSABS 0.1 06/15/2012 1116   Results for Mckiddy, Samuel Meyer (MRN [id number]) as of 07/12/2012 12:29  Ref. Range 09/30/2009 13:40 06/02/2010 13:28 09/09/2010 10:27 02/17/2011 13:48 04/01/2011 13:09 04/27/2011 10:43 06/14/2011 10:48 07/14/2011 15:10 01/04/2012 11:46 06/15/2012 11:16  Alkaline Phosphatase Latest Range: 39-117 U/L 42  43 41 47 44    43  Albumin Latest Range: 3.5-5.2 g/dL 4.8  4.3 4.7 4.7 4.5    4.5  AST Latest Range: 0-37 U/L 38 (H) 46 (H) 39 (H) 43 (H) 33 40 (H) 38 (H)   70 (H)  ALT Latest Range: 0-53 U/L 48 75 (H) 47 69 (H) 60 (H) 64 (H) 62 (H)   92 (H)  Total Protein Latest Range: 6.0-8.3 g/dL 7.8  7.8 7.8 8.4 (H) 7.8    8.0  Bilirubin, Direct Latest Range: 0.0-0.3 mg/dL 0.2  0.1  0.0       Total Bilirubin Latest Range: 0.3-1.2 mg/dL 0.7  0.7 0.6 1.0 0.7    1.1   Hep C Ab - neg Hep B surg Ag/Ab, core total - neg  CT  ABDOMEN WITHOUT AND WITH CONTRAST  -- 2009 CT PELVIS WITH CONTRAST    Technique: Multidetector CT imaging of the abdomen was performed initially following the standard protocol before administration of intravenous contrast.  Multidetector CT imaging of the abdomen  and pelvis was then performed following the standard protocol during the bolus injection of intravenous contrast.    Contrast: 125 ml intravenous Omnipaque-300.    Comparison: None    CT ABDOMEN    Findings:  Mild bibasilar atelectasis is noted.    Moderate to severe diffuse fatty infiltration of the liver is present. The spleen, adrenal glands, pancreas, and gallbladder are unremarkable.    There is mild to moderate right hydronephrosis caused by two adjacent proximal right ureteral calculi measuring 3 mm and 5 mm (image 60, series 3 and image 88, series 602). Punctate nonobstructing bilateral renal calculi are also identified.    No free fluid, enlarged lymph nodes, biliary dilation or abdominal aortic aneurysm identified. The visualized bowel is unremarkable. No acute bony abnormalities are identified.    IMPRESSION: Two separate adjacent proximal right ureteral calculi (3 mm and 5 mm) causing mild to moderate right hydronephrosis.    Punctate nonobstructing bilateral renal calculi.    Moderate to severe diffuse fatty infiltration of the liver.    CT PELVIS    Findings:  Colonic diverticulosis is identified without evidence of diverticulitis. The remainder of the visualized bowel is unremarkable. There is no evidence of free fluid, enlarged lymph nodes, or urinary calculi within the pelvis. No acute bony abnormalities are identified.    IMPRESSION:    No evidence of acute abnormality within the pelvis.    Colonic diverticulosis.  ASSESSMENT/PLAN:  62 yo male with PMH of diabetes, sleep apnea, psoriatic arthritis on Enbrel, hyperlipidemia and hypertension who seen in consultation at the request of  Dr. Drue Novel for evaluation of elevated liver enzymes and consideration of colonoscopy for screening.   1.  Elevated liver enzymes -- mild biggest suspicion is for fatty liver disease (NASH).  There are no signs of chronic or advanced liver disease.  An old CT scan performed for another reason did show moderate to severe fatty infiltration of the liver from 2009. He has risk factors to go along with fatty liver disease including diabetes, being overweight, hypertension and hyperlipidemia. We discussed fatty liver disease today at length, and I do feel that it is important to rule out other possible causes of elevated liver enzymes. His serum transaminases have been only mildly elevated 1-2 times the upper limit of normal. We will perform labs today including ANA, IgG to exclude autoimmune hepatitis. He has one autoimmune disease in psoriatic arthritis and I would like to rule out any autoimmune liver inflammation.  Also we'll perform other liver tests to include iron studies, ASMA, AMA, ceruloplasmin, alpha-1.  I will check his immunity to hepatitis Meyer and B., and if he is not immune I feel he should be vaccinated. Finally, I have ordered Meyer liver ultrasound. We briefly discussed liver biopsy to confirm the diagnosis, but this point I do not think it is necessary. He associates his liver enzymes with Enbrel, and while I cannot completely exclude this, I feel fatty liver disease is more likely. We discussed treatment which is primarily weight loss. He reports his endocrinologist is recommended weight loss to improve his diabetes, and he would like to aggressively pursue Meyer 20-25 pound weight loss.  If he is able to lose this weight, I would not be surprised if he normalizes his liver enzymes.  We'll follow this going forward  2.  CRC screening -- he is due for colorectal cancer screening and we discussed options including barium enema and CT colonoscopy. He would like to pursue standard colonoscopy,  and after discussion  of the risks and benefits he is agreeable to proceed.

## 2012-07-13 LAB — HEPATITIS B SURFACE ANTIBODY,QUALITATIVE: Hep B S Ab: NONREACTIVE

## 2012-07-13 LAB — HEPATITIS A ANTIBODY, TOTAL: Hep A Total Ab: NEGATIVE

## 2012-07-13 LAB — ANA: Anti Nuclear Antibody(ANA): NEGATIVE

## 2012-07-13 LAB — ANTI-SMOOTH MUSCLE ANTIBODY, IGG: Smooth Muscle Ab: 4 U (ref <20)

## 2012-07-14 ENCOUNTER — Ambulatory Visit (HOSPITAL_COMMUNITY)
Admission: RE | Admit: 2012-07-14 | Discharge: 2012-07-14 | Disposition: A | Discharge status: Home / Self Care | Payer: BC Managed Care – PPO | Source: Ambulatory Visit | Attending: Internal Medicine | Admitting: Internal Medicine

## 2012-07-14 DIAGNOSIS — N200 Calculus of kidney: Secondary | ICD-10-CM | POA: Insufficient documentation

## 2012-07-14 DIAGNOSIS — R7989 Other specified abnormal findings of blood chemistry: Secondary | ICD-10-CM | POA: Insufficient documentation

## 2012-07-14 DIAGNOSIS — K7689 Other specified diseases of liver: Secondary | ICD-10-CM | POA: Insufficient documentation

## 2012-07-18 ENCOUNTER — Other Ambulatory Visit: Payer: Self-pay

## 2012-07-18 DIAGNOSIS — Z23 Encounter for immunization: Secondary | ICD-10-CM

## 2012-07-21 ENCOUNTER — Telehealth: Payer: Self-pay | Admitting: Internal Medicine

## 2012-07-21 MED ORDER — PEG-KCL-NACL-NASULF-NA ASC-C 100 G PO SOLR: 1.0000 | Freq: Once | ORAL | Status: DC

## 2012-07-21 NOTE — Telephone Encounter (Signed)
Rx sent to pt's pharamacy on a rush order

## 2012-07-31 ENCOUNTER — Telehealth: Payer: Self-pay | Admitting: Internal Medicine

## 2012-07-31 ENCOUNTER — Encounter: Payer: Self-pay | Admitting: *Deleted

## 2012-07-31 MED ORDER — GLIMEPIRIDE 4 MG PO TABS: ORAL_TABLET | ORAL | Status: DC

## 2012-07-31 MED ORDER — METFORMIN HCL 1000 MG PO TABS: ORAL_TABLET | ORAL | Status: DC

## 2012-07-31 NOTE — Telephone Encounter (Signed)
Refill done.  

## 2012-07-31 NOTE — Telephone Encounter (Signed)
Refill- glimepiride 4mg  tab. Take one by mouth two times daily. Qty 90 days supply  Refill- metformin 1000mg  tab. Take 1 1/2 by mouth twice daily. Qty 90 days supply

## 2012-08-08 ENCOUNTER — Encounter: Payer: BC Managed Care – PPO | Admitting: Internal Medicine

## 2012-08-22 ENCOUNTER — Telehealth: Payer: Self-pay | Admitting: *Deleted

## 2012-08-22 NOTE — Telephone Encounter (Signed)
Message copied by Florene Glen on Tue Aug 22, 2012  8:28 AM ------      Message from: Annett Fabian      Created: Tue Jul 18, 2012  2:45 PM       Make sure patient started his Twinrix series here or at Dr. Drue Novel.   ------

## 2012-08-25 NOTE — Telephone Encounter (Signed)
Informed pt of the need for Twin rix injections. He would have self injected, but his insurance would not cover the med per the pharmacy, [facility], and pt could call for medical coverage. Pt will come in here for his injections and he will call one day next week for his appt.

## 2012-09-01 ENCOUNTER — Other Ambulatory Visit: Payer: Self-pay | Admitting: Internal Medicine

## 2012-09-01 ENCOUNTER — Ambulatory Visit (AMBULATORY_SURGERY_CENTER): Payer: BC Managed Care – PPO | Admitting: Internal Medicine

## 2012-09-01 ENCOUNTER — Encounter: Payer: Self-pay | Admitting: Internal Medicine

## 2012-09-01 VITALS — BP 117/67 | HR 78 | Temp 97.6°F | Resp 18 | Ht 73.0 in | Wt 257.0 lb

## 2012-09-01 DIAGNOSIS — Z1211 Encounter for screening for malignant neoplasm of colon: Secondary | ICD-10-CM

## 2012-09-01 DIAGNOSIS — D126 Benign neoplasm of colon, unspecified: Secondary | ICD-10-CM

## 2012-09-01 LAB — GLUCOSE, CAPILLARY
Glucose-Capillary: 160 mg/dL — ABNORMAL HIGH (ref 70–99)
Glucose-Capillary: 143 mg/dL — ABNORMAL HIGH (ref 70–99)

## 2012-09-01 MED ORDER — SODIUM CHLORIDE 0.9 % IV SOLN: 500.0000 mL | INTRAVENOUS | Status: DC

## 2012-09-01 NOTE — Progress Notes (Signed)
Patient did not experience any of the following events: a burn prior to discharge; a fall within the facility; wrong site/side/patient/procedure/implant event; or a hospital transfer or hospital admission upon discharge from the facility. (G8907) Patient did not have preoperative order for IV antibiotic SSI prophylaxis. (G8918)  

## 2012-09-01 NOTE — Patient Instructions (Addendum)

## 2012-09-01 NOTE — Progress Notes (Signed)
Called to room to assist during endoscopic procedure.  Patient ID and intended procedure confirmed with present staff. Received instructions for my participation in the procedure from the performing physician.  

## 2012-09-01 NOTE — Op Note (Signed)
[facility] [address] [place], [address]   COLONOSCOPY PROCEDURE REPORT  PATIENT: [name], [name]  MR#: [id number] BIRTHDATE: January 01, 1951 , 61  yrs. old GENDER: Male ENDOSCOPIST: Beverley Fiedler, MD REFERRED AO:ZHYQ Drue Novel, M.D. PROCEDURE DATE:  09/01/2012 PROCEDURE:   Colonoscopy with snare polypectomy ASA CLASS:   Class II INDICATIONS:average risk screening and first colonoscopy. MEDICATIONS: MAC sedation, administered by CRNA and propofol (Diprivan) 400mg  IV  DESCRIPTION OF PROCEDURE:   After the risks benefits and alternatives of the procedure were thoroughly explained, informed consent was obtained.  A digital rectal exam revealed no rectal mass.   The LB [id number]-[id number]  endoscope was introduced through the anus and advanced to the terminal ileum which was intubated for a short distance. No adverse events experienced.   The quality of the prep was good, using MoviPrep  The instrument was then slowly withdrawn as the colon was fully examined.     COLON FINDINGS: The mucosa appeared normal in the terminal ileum. Two sessile polyps measuring 5 mm in size were found in the transverse colon.  Polypectomy was performed using cold snare.  All resections were complete and all polyp tissue was completely retrieved.   Five sessile polyps measuring 4-6 mm in size were found in the descending colon, sigmoid colon, and rectum. Polypectomy was performed using cold snare.  All resections were complete and all polyp tissue was completely retrieved.   A sessile polyp measuring 8 mm in size was found in the rectum.  A polypectomy was performed using snare cautery.  The resection was complete and the polyp tissue was completely retrieved.   There was moderate diverticulosis noted in the descending colon and sigmoid colon with associated muscular hypertrophy.   Small internal hemorrhoids were found.  Retroflexed views revealed internal hemorrhoids. The time to cecum=1  minutes 48 seconds.  Withdrawal time=25 minutes 40 seconds.  The scope was withdrawn and the procedure completed.  COMPLICATIONS: There were no complications.  ENDOSCOPIC IMPRESSION: 1.   Normal mucosa in the terminal ileum 2.   Two sessile polyps measuring 5 mm in size were found in the transverse colon; Polypectomy was performed using cold snare 3.   Five sessile polyps measuring 4-6 mm in size were found in the descending colon, sigmoid colon, and rectum; Polypectomy was performed using cold snare 4.   Sessile polyp measuring 8 mm in size was found in the rectum; polypectomy was performed using snare cautery 5.   There was moderate diverticulosis noted in the descending colon and sigmoid colon 6.   Small internal hemorrhoids  RECOMMENDATIONS: 1.  Hold aspirin, aspirin products, and anti-inflammatory medication for 2 weeks. 2.  Await pathology results 3.  High fiber diet 4.  If the polyps removed today are proven to be adenomatous (pre-cancerous) polyps, you will need a colonoscopy in 3 years. Otherwise you should continue to follow colorectal cancer screening guidelines for "routine risk" patients with a colonoscopy in 10 years.  You will receive a letter within 1-2 weeks with the results of your biopsy as well as final recommendations.  Please call my office if you have not received a letter after 3 weeks.   eSigned:  Beverley Fiedler, MD 09/01/2012 3:28 PM   cc: The Patient   PATIENT NAME:  Samuel Meyer, Samuel Meyer MR#: [id number]

## 2012-09-05 ENCOUNTER — Telehealth: Payer: Self-pay | Admitting: *Deleted

## 2012-09-05 NOTE — Telephone Encounter (Signed)
Name identifier, left message, follow-up 

## 2012-09-07 ENCOUNTER — Encounter: Payer: Self-pay | Admitting: Internal Medicine

## 2012-09-11 ENCOUNTER — Telehealth: Payer: Self-pay

## 2012-09-11 NOTE — Telephone Encounter (Signed)
Patient came in to start Twinrix injection series.  Unfortunately we are out and do not know when we will get it in, on backorder per Darcey Nora, [facility] .  I called Dr. Leta Jungling office , his PCP, they have 19 doses in stock.  I have called [facility] and left him a message on his voice mail with this information so he can call and set up a nurse appointment per Alonna Minium at Dr. Leta Jungling office.  VM left at # [phone].  Told him to call us back with any questions.

## 2012-09-14 ENCOUNTER — Encounter: Payer: Self-pay | Admitting: Internal Medicine

## 2012-09-14 ENCOUNTER — Ambulatory Visit (INDEPENDENT_AMBULATORY_CARE_PROVIDER_SITE_OTHER): Payer: BC Managed Care – PPO | Admitting: *Deleted

## 2012-09-14 DIAGNOSIS — Z23 Encounter for immunization: Secondary | ICD-10-CM

## 2012-09-21 ENCOUNTER — Ambulatory Visit (INDEPENDENT_AMBULATORY_CARE_PROVIDER_SITE_OTHER): Payer: BC Managed Care – PPO | Admitting: *Deleted

## 2012-09-21 DIAGNOSIS — Z23 Encounter for immunization: Secondary | ICD-10-CM

## 2012-10-02 ENCOUNTER — Telehealth: Payer: Self-pay | Admitting: *Deleted

## 2012-10-02 MED ORDER — GLUCOSE BLOOD VI DISK: DISK | Status: DC

## 2012-10-02 NOTE — Telephone Encounter (Signed)
Refill request for bayer breeze 2 test strips. Refill done.

## 2012-10-05 ENCOUNTER — Ambulatory Visit (INDEPENDENT_AMBULATORY_CARE_PROVIDER_SITE_OTHER): Payer: BC Managed Care – PPO

## 2012-10-05 DIAGNOSIS — Z23 Encounter for immunization: Secondary | ICD-10-CM

## 2012-11-15 ENCOUNTER — Other Ambulatory Visit: Payer: Self-pay | Admitting: Internal Medicine

## 2012-12-19 ENCOUNTER — Ambulatory Visit (INDEPENDENT_AMBULATORY_CARE_PROVIDER_SITE_OTHER): Payer: BC Managed Care – PPO | Admitting: Internal Medicine

## 2012-12-19 ENCOUNTER — Encounter: Payer: Self-pay | Admitting: Internal Medicine

## 2012-12-19 VITALS — BP 136/92 | HR 74 | Temp 98.5°F | Wt 250.4 lb

## 2012-12-19 DIAGNOSIS — R5381 Other malaise: Secondary | ICD-10-CM

## 2012-12-19 DIAGNOSIS — Z23 Encounter for immunization: Secondary | ICD-10-CM

## 2012-12-19 DIAGNOSIS — E559 Vitamin D deficiency, unspecified: Secondary | ICD-10-CM

## 2012-12-19 DIAGNOSIS — E785 Hyperlipidemia, unspecified: Secondary | ICD-10-CM

## 2012-12-19 DIAGNOSIS — I10 Essential (primary) hypertension: Secondary | ICD-10-CM

## 2012-12-19 DIAGNOSIS — R948 Abnormal results of function studies of other organs and systems: Secondary | ICD-10-CM

## 2012-12-19 LAB — BASIC METABOLIC PANEL
CO2: 23 mEq/L (ref 19–32)
Chloride: 103 mEq/L (ref 96–112)
Potassium: 4.1 mEq/L (ref 3.5–5.1)

## 2012-12-19 LAB — LDL CHOLESTEROL, DIRECT: Direct LDL: 125.9 mg/dL

## 2012-12-19 LAB — CBC WITH DIFFERENTIAL/PLATELET
Eosinophils Relative: 1.2 % (ref 0.0–5.0)
Monocytes Relative: 7.8 % (ref 3.0–12.0)
Neutrophils Relative %: 66.2 % (ref 43.0–77.0)
Platelets: 243 10*3/uL (ref 150.0–400.0)
WBC: 9 10*3/uL (ref 4.5–10.5)

## 2012-12-19 LAB — LIPID PANEL
Cholesterol: 199 mg/dL (ref 0–200)
Total CHOL/HDL Ratio: 5
Triglycerides: 237 mg/dL — ABNORMAL HIGH (ref 0.0–149.0)
VLDL: 47.4 mg/dL — ABNORMAL HIGH (ref 0.0–40.0)

## 2012-12-19 LAB — TSH: TSH: 1.5 u[IU]/mL (ref 0.35–5.50)

## 2012-12-19 NOTE — Assessment & Plan Note (Signed)
Controlled, check a BMP 

## 2012-12-19 NOTE — Patient Instructions (Signed)
Get your blood work before you leave  Next visit in  4 months for a routine office visit Please make an appointment before you leave the office today (or call few weeks in advance)     

## 2012-12-19 NOTE — Assessment & Plan Note (Addendum)
One-year history of fatigue despite what seems to be a better  lifestyle and better diabetes control with average CBG of 122. B12 suplements did not help He has a history of mild sleep apnea, we discussed retesting, declined at this time Check a vitamin D level, he is status post replacement, also CBC and a TSH. Re asses in 4 months

## 2012-12-19 NOTE — Assessment & Plan Note (Signed)
Has improved his lifestyle, diabetes seems to be better, check FLP

## 2012-12-19 NOTE — Assessment & Plan Note (Signed)
Re do labs

## 2012-12-19 NOTE — Progress Notes (Signed)
  Subjective:    Patient ID: Samuel Meyer, male    DOB: 1950/04/13, 62 y.o.   MRN: [id number]  HPI ROV Hypertension, good medication compliance, BP today 156/92, at home is usually 120/80. Increased LFTs, chart reviewed, see assessment and plan. Low vitamin D, status post replacement. Complained of fatigue for at least a year, is just a sense of general lack of energy. On further questioning he denies dyspnea on exertion, lower extremity edema but has + snoring, reports a remote sleep study that showed mild sleep apnea. Occasionally feels a sleepy in the afternoons but not inappropriate falling asleep. Diabetes, hypogonadism is followup by Dr. Lisabeth Devoid. Good compliance with testosterone .  Past Medical History  Diagnosis Date  . Diabetes mellitus 2/09  . Sleep apnea     no treatment, pt denies diagnosis  . Psoriatic arthritis     [facility] started on methotrexate 03/12/09, switch to enbrel aprox 2/11  . S/P cardiac cath   . Skin cancer     remotely, 4th L finger, not melanoma  . Testosterone deficiency   . Hyperlipidemia   . Hypertension   . Kidney stone     Past Surgical History: Appendectomy (1993) Tonsillectomy (1960) Knee surgery (1969) R shoulder ~ 2008  Family History: Father: deceased cerebral hemorrhage; stroke x 2 CAD-- F CABG, first CABG at age 68 HTN--mother DM--F Siblings: glaucoma lung Ca--GM prostate ca--no colon ca--no    Social History: Married, 1 daughter works on  business development tobacco-- quit at age 35 ETOH-- no   Review of Systems Diet and exercise have definitely improved, ambulatory CBGs on average 122. See history of present illness. Denies any chest pain, palpitations. No nausea, vomiting, diarrhea. No depression-anxiety    Objective:   Physical Exam General -- alert, well-developed, NAD.  Neck --no thyromegaly , No JVD HEENT-- Not pale.  Lungs -- normal respiratory effort, no intercostal retractions, no accessory muscle use, and  normal breath sounds.  Heart-- normal rate, regular rhythm, no murmur.  Abdomen-- Not distended, good bowel sounds,soft, non-tender.  Extremities-- no pretibial edema bilaterally  Psych-- Cognition and judgment appear intact. Alert and cooperative with normal attention span and concentration. not anxious appearing and not depressed appearing.      Assessment & Plan:

## 2012-12-19 NOTE — Assessment & Plan Note (Addendum)
Saw GI: Ultrasound showed fatty liver Extensive labs were negative except for a mildly abnormal ceruloplasmin. Reassess on return to the office

## 2013-03-13 ENCOUNTER — Telehealth: Payer: Self-pay | Admitting: Internal Medicine

## 2013-03-13 NOTE — Telephone Encounter (Signed)
Patient states that he left a VM on the triage line yesterday morning about his blood pressure which he has been trying to lower over the past 2 weeks. States that he inquiring to see if Dr. Drue Novel wanted to adjust his rx. Offered patient an appointment but he declined and states he wants his inquiry to be brought to Dr. Leta Jungling attention 1st to see if an appointment is necessary. Please advise.

## 2013-03-13 NOTE — Telephone Encounter (Signed)
Please advise 

## 2013-03-13 NOTE — Telephone Encounter (Signed)
Please call the patient, I need to know  some of his BP readings, medication compliance and if he has any side effects. Let me know

## 2013-03-15 MED ORDER — AMLODIPINE BESYLATE 10 MG PO TABS: 10.0000 mg | ORAL_TABLET | Freq: Every day | ORAL | Status: DC

## 2013-03-15 NOTE — Telephone Encounter (Signed)
Advise patient:  Add amlodipine 10 mg one by mouth daily call #30 and 3 refills Continue monitoring his BPs, goal 120/80. Watch for side effects mainly lower extremity edema, call if BP continues to be high or drops too much, less than 110 /70

## 2013-03-15 NOTE — Telephone Encounter (Signed)
Pt notified rx ordered.

## 2013-03-15 NOTE — Telephone Encounter (Signed)
Spoke to pt. Pt states been averaging in the 140/90's the past week. Takes Benicar 40mg  one tablet daily, denies having any negative signs or symptoms regarding B/P .

## 2013-03-15 NOTE — Addendum Note (Signed)
Addended by: Eustace Quail on: 03/15/2013 02:15 PM   Modules accepted: Orders

## 2013-03-15 NOTE — Telephone Encounter (Signed)
See below, call the pt and let me know

## 2013-03-16 ENCOUNTER — Telehealth: Payer: Self-pay | Admitting: *Deleted

## 2013-03-16 NOTE — Telephone Encounter (Signed)
Attempted to contact patient to follow up on elevated B/P and change in therapy

## 2013-03-16 NOTE — Telephone Encounter (Signed)
error 

## 2013-03-21 ENCOUNTER — Encounter: Payer: Self-pay | Admitting: Internal Medicine

## 2013-03-21 NOTE — Telephone Encounter (Signed)
Error

## 2013-04-17 ENCOUNTER — Encounter: Payer: Self-pay | Admitting: Internal Medicine

## 2013-04-17 ENCOUNTER — Ambulatory Visit (INDEPENDENT_AMBULATORY_CARE_PROVIDER_SITE_OTHER): Payer: BC Managed Care – PPO | Admitting: Internal Medicine

## 2013-04-17 ENCOUNTER — Telehealth: Payer: Self-pay | Admitting: Internal Medicine

## 2013-04-17 VITALS — BP 75/51 | HR 110 | Temp 97.7°F | Wt 242.0 lb

## 2013-04-17 DIAGNOSIS — I10 Essential (primary) hypertension: Secondary | ICD-10-CM

## 2013-04-17 DIAGNOSIS — E119 Type 2 diabetes mellitus without complications: Secondary | ICD-10-CM

## 2013-04-17 LAB — CBC WITH DIFFERENTIAL/PLATELET
Basophils Absolute: 0 10*3/uL (ref 0.0–0.1)
Basophils Relative: 0.2 % (ref 0.0–3.0)
Eosinophils Absolute: 0.1 10*3/uL (ref 0.0–0.7)
Eosinophils Relative: 0.4 % (ref 0.0–5.0)
HCT: 50.6 % (ref 39.0–52.0)
Hemoglobin: 17.1 g/dL — ABNORMAL HIGH (ref 13.0–17.0)
Lymphocytes Relative: 15.5 % (ref 12.0–46.0)
Lymphs Abs: 2.6 10*3/uL (ref 0.7–4.0)
MCHC: 33.8 g/dL (ref 30.0–36.0)
MCV: 93.5 fl (ref 78.0–100.0)
Monocytes Absolute: 1.7 10*3/uL — ABNORMAL HIGH (ref 0.1–1.0)
Monocytes Relative: 10.3 % (ref 3.0–12.0)
Neutro Abs: 12.1 10*3/uL — ABNORMAL HIGH (ref 1.4–7.7)
Neutrophils Relative %: 73.6 % (ref 43.0–77.0)
Platelets: 221 10*3/uL (ref 150.0–400.0)
RBC: 5.41 Mil/uL (ref 4.22–5.81)
RDW: 13.2 % (ref 11.5–14.6)
WBC: 16.5 10*3/uL — ABNORMAL HIGH (ref 4.5–10.5)

## 2013-04-17 LAB — BASIC METABOLIC PANEL
BUN: 21 mg/dL (ref 6–23)
CO2: 25 mEq/L (ref 19–32)
Calcium: 9 mg/dL (ref 8.4–10.5)
Chloride: 97 mEq/L (ref 96–112)
Creatinine, Ser: 1.8 mg/dL — ABNORMAL HIGH (ref 0.4–1.5)
GFR: 40.25 mL/min — ABNORMAL LOW (ref >60.00)
Glucose, Bld: 166 mg/dL — ABNORMAL HIGH (ref 70–99)
Potassium: 3.8 mEq/L (ref 3.5–5.1)
Sodium: 134 mEq/L — ABNORMAL LOW (ref 135–145)

## 2013-04-17 NOTE — Telephone Encounter (Signed)
Labs showing increased WBCs, significant change on the creatinine, it went from 1.0 to 1.8, etiology of abnormalities unclear although increasing renal function may be due to to reason low blood pressure. Plan: Hold invokana and metformin Renal ultrasound Office visit in 2 days to recheck BP and labs, ua  I talked to the patient about the results and plan, he's not taking any Motrin or similar medications and is in agreement to come back in 2 days.

## 2013-04-17 NOTE — Assessment & Plan Note (Addendum)
The last time he was here, BP was elevated, amlodipine was added to his regimen however he discontinue Benicar and stay on amlodipine only. Initially it worked, then his BP gradually increased to a diastolic of 90 Consequently he went back on Benicar and stopped amlodipine. In the last few days his BP has been low, he is asymptomatic except for mild dizziness. Last amlodipine was about 10 days ago, last Benicar ~ 2 days ago. No clear etiology for his low BP, he is not toxic, no GI-CV symptoms. He did start invokana few months ago. Plan: Discontinue amlodipine, hold Benicar, BP check in 2 days, see instructions. Check a CBC and a BMP. Time spent with patient today 25 minutes mostly counseling about the plan of care.

## 2013-04-17 NOTE — Progress Notes (Signed)
Pre visit review using our clinic review tool, if applicable. No additional management support is needed unless otherwise documented below in the visit note. 

## 2013-04-17 NOTE — Progress Notes (Signed)
   Subjective:    Patient ID: Samuel Meyer, male    DOB: 27-Oct-1950, 63 y.o.   MRN: [id number]  HPI ROV, today we discussed the following issues: Hypertension-since the last time he was here, BP was  increased and amlodipine was added, later on BP started to decrease and he has not been taking any medication for the last 2 days, see assessment and plan. His BP upon arrival today was 75/51 , is slightly tachycardic, BP confirmed by me was in the 80s. He feels well except for mild dizziness  Past Medical History  Diagnosis Date  . Diabetes mellitus 2/09  . Sleep apnea     no treatment, pt denies diagnosis  . Psoriatic arthritis     [facility] started on methotrexate 03/12/09, switch to enbrel aprox 2/11  . S/P cardiac cath   . Skin cancer     remotely, 4th L finger, not melanoma  . Testosterone deficiency   . Hyperlipidemia   . Hypertension   . Kidney stone     Past Surgical History  Procedure Laterality Date  . Appendectomy  1993  . Tonsillectomy  1960  . Knee surgery  1969    left  . Shoulder surgery  2008   History   Social History  . Marital Status: Married    Spouse Name: N/A    Number of Children: 1  . Years of Education: N/A   Occupational History  . works on  Librarian, academic    Social History Main Topics  . Smoking status: Former Smoker    Quit date: 04/12/1978  . Smokeless tobacco: Never Used  . Alcohol Use: No  . Drug Use: No  . Sexual Activity: Not on file   Other Topics Concern  . Not on file   Social History Narrative  . No narrative on file     Family History: Father: deceased cerebral hemorrhage; stroke x 2 CAD-- F CABG, first CABG at age 29 HTN--mother DM--F Siblings: glaucoma lung Ca--GM prostate ca--no colon ca--no     Review of Systems Denies chest pain or shortness or breath Appetite okay, fluids intake okay. Denies nausea, vomiting, change in the color of the stools or blood in the stools. Has not noted any changes in the  size or color on his routine pills.    Objective:   Physical Exam BP 75/51  Pulse 110  Temp(Src) 97.7 F (36.5 C)  Wt 242 lb (109.77 kg)  SpO2 95% General -- alert, well-developed, NAD.  HEENT-- Not pale.   Lungs -- normal respiratory effort, no intercostal retractions, no accessory muscle use, and normal breath sounds.  Heart-- normal rate, regular rhythm, no murmur.   Extremities-- no pretibial edema bilaterally  Neurologic--  alert & oriented X3. Speech normal, gait normal, strength normal in all extremities.  Psych-- Cognition and judgment appear intact. Cooperative with normal attention span and concentration. No anxious or depressed appearing.      Assessment & Plan:

## 2013-04-17 NOTE — Patient Instructions (Signed)
Get your blood work before you leave  Next visit is for routine check up  --  Hypertension -- in 1 months    Stop amlodipine HOLD Benicar Check the  blood pressure every day be sure it is between 110/60 and 140/85. Ideal blood pressure is 120/80. If it is  higher than 160/90 or lower 90/70 , let me know Walk-in in 2 days , ask for Shanon Brow my assistant, will check your BP here

## 2013-04-18 ENCOUNTER — Encounter: Payer: Self-pay | Admitting: Internal Medicine

## 2013-04-18 ENCOUNTER — Other Ambulatory Visit: Payer: Self-pay | Admitting: Internal Medicine

## 2013-04-18 ENCOUNTER — Telehealth: Payer: Self-pay | Admitting: Internal Medicine

## 2013-04-18 ENCOUNTER — Telehealth: Payer: Self-pay

## 2013-04-18 ENCOUNTER — Telehealth: Payer: Self-pay | Admitting: *Deleted

## 2013-04-18 NOTE — Telephone Encounter (Signed)
Provider communicating with pt via My Chart with B/P readings.

## 2013-04-18 NOTE — Telephone Encounter (Signed)
Relevant patient education assigned to patient using Emmi. ° °

## 2013-04-18 NOTE — Telephone Encounter (Signed)
[facility] Triage Call Report Triage Record Num: [id number] Operator: Feliberto Harts Patient Name: Samuel Meyer Call Date & Time: 04/17/2013 10:00:50PM Patient Phone: [phone] PCP: Patient Gender: Male PCP Fax : Patient DOB: 07-04-1950 Practice Name: [facility] Reason for Call: Caller: Ocie Cornfield; PCP: Kathlene November; CB#: [phone]; Call regarding Low BP of 74/56; 04/17/2013 Seen in office today by Dr. Larose Kells and scheduled to see Dr. Larose Kells again on 04/19/2013. Pt. feels cold and clamy earlier this evening but feels good now. Pt. c/o neck hurting since 04/15/2013. Pt. states neck pain is from old injury. No chest discomfort or problems breathing. Feels dizzy when up and active. Dr. Larose Kells took pt. off diabetic/ Metformin. Pt. has not taken Benicar or any other meds for past 2 days. Pt. also states urine pale pink in color. No UTI symptoms. Pt. checked sugar at 18:00 and was 186. Wife states has been drinking water well today. Afebrile. BP 73/55, pulse 94 at 22:40. Current Glucose 172. Triaged per Dizziness guideline. DispositionSee Provider within 24 hours per "Symptoms began after starting or changing dose of prescription, non prescription medications". Care advice given. Pt. to call office in AM to let them know BP status and be seen if necessary. Has appt. scheduled 04/19/2013. Pt. voices understanding. Protocol(s) Used: Dizziness or Vertigo Recommended Outcome per Protocol: See Provider within 24 hours Reason for Outcome: Symptoms began after starting or changing dose of prescription, nonprescription, alternative medication, or illicit drug Care Advice: ~ DO NOT drive until condition evaluated. ~ Protect from falling or other injury. Avoid caffeine (coffee, tea, cola drinks, or chocolate), alcohol, and nicotine (use of tobacco), as use of these substances may worsen symptoms. ~ ~ Call provider immediately if have difficulty walking, vision problems, or weakness. ~ List, or  take, all current prescription(s), nonprescription or alternative medication(s) to provider for evaluation. ~ Lie still in a dimly lit room and avoid any sudden change in position. A temporary drop in blood pressure sometimes occurs with a quick change to an upright position (postural hypotension) and may cause light-headedness or dizziness. Change position slowly. Making a habit of rising slowly and sitting for a few minutes before standing to walk usually relieves the feeling of faintness. ~ When feeling faint, find a place to lie down if possible, and elevate legs 8 to 12 inches above the heart. If unable to lie down, sit in a chair and lower head between the knees for 3 to 5 minutes. If standing and not able to sit, cross legs and squeeze the knees together to move blood to the heart. ~

## 2013-04-18 NOTE — Addendum Note (Signed)
Addended by: Peggyann Shoals on: 04/18/2013 11:03 AM   Modules accepted: Orders

## 2013-04-19 ENCOUNTER — Ambulatory Visit (INDEPENDENT_AMBULATORY_CARE_PROVIDER_SITE_OTHER): Payer: BC Managed Care – PPO | Admitting: Internal Medicine

## 2013-04-19 ENCOUNTER — Encounter: Payer: Self-pay | Admitting: Internal Medicine

## 2013-04-19 VITALS — BP 129/84 | HR 90 | Temp 98.0°F | Wt 244.0 lb

## 2013-04-19 DIAGNOSIS — D72829 Elevated white blood cell count, unspecified: Secondary | ICD-10-CM

## 2013-04-19 DIAGNOSIS — I10 Essential (primary) hypertension: Secondary | ICD-10-CM

## 2013-04-19 DIAGNOSIS — E119 Type 2 diabetes mellitus without complications: Secondary | ICD-10-CM

## 2013-04-19 LAB — COMPREHENSIVE METABOLIC PANEL
ALK PHOS: 46 U/L (ref 39–117)
ALT: 28 U/L (ref 0–53)
AST: 19 U/L (ref 0–37)
Albumin: 4.2 g/dL (ref 3.5–5.2)
BUN: 17 mg/dL (ref 6–23)
CO2: 24 mEq/L (ref 19–32)
CREATININE: 1 mg/dL (ref 0.4–1.5)
Calcium: 9.2 mg/dL (ref 8.4–10.5)
Chloride: 104 mEq/L (ref 96–112)
GFR: 80.34 mL/min (ref >60.00)
Glucose, Bld: 159 mg/dL — ABNORMAL HIGH (ref 70–99)
Potassium: 4 mEq/L (ref 3.5–5.1)
Sodium: 139 mEq/L (ref 135–145)
Total Bilirubin: 0.6 mg/dL (ref 0.3–1.2)
Total Protein: 8.2 g/dL (ref 6.0–8.3)

## 2013-04-19 LAB — URINALYSIS, ROUTINE W REFLEX MICROSCOPIC
Leukocytes, UA: NEGATIVE
Nitrite: NEGATIVE
Total Protein, Urine: 30 — AB
URINE GLUCOSE: 500 — AB
UROBILINOGEN UA: 0.2 (ref 0.0–1.0)
pH: 5.5 (ref 5.0–8.0)

## 2013-04-19 LAB — CBC WITH DIFFERENTIAL/PLATELET
BASOS PCT: 0.4 % (ref 0.0–3.0)
Basophils Absolute: 0 10*3/uL (ref 0.0–0.1)
EOS ABS: 0.1 10*3/uL (ref 0.0–0.7)
Eosinophils Relative: 1.2 % (ref 0.0–5.0)
HCT: 48.2 % (ref 39.0–52.0)
HEMOGLOBIN: 16.5 g/dL (ref 13.0–17.0)
LYMPHS PCT: 22.7 % (ref 12.0–46.0)
Lymphs Abs: 1.9 10*3/uL (ref 0.7–4.0)
MCHC: 34.3 g/dL (ref 30.0–36.0)
MCV: 92.6 fl (ref 78.0–100.0)
MONO ABS: 0.6 10*3/uL (ref 0.1–1.0)
Monocytes Relative: 7.6 % (ref 3.0–12.0)
Neutro Abs: 5.8 10*3/uL (ref 1.4–7.7)
Neutrophils Relative %: 68.1 % (ref 43.0–77.0)
Platelets: 239 10*3/uL (ref 150.0–400.0)
RBC: 5.21 Mil/uL (ref 4.22–5.81)
RDW: 13.4 % (ref 11.5–14.6)
WBC: 8.5 10*3/uL (ref 4.5–10.5)

## 2013-04-19 LAB — HEMOGLOBIN A1C: Hgb A1c MFr Bld: 7.7 % — ABNORMAL HIGH (ref 4.6–6.5)

## 2013-04-19 NOTE — Progress Notes (Signed)
   Subjective:    Patient ID: Samuel Meyer, male    DOB: 1951-01-19, 63 y.o.   MRN: [id number]  HPI Followup from his visit 2 days ago. The patient was seen with hypotension, on looking back he was feeling slightly dizzy and was very sleepy. He is currently holding 3 medications, his BP is coming back to normal, he feels very well and is back to his normal self. Reports no problems.  Past Medical History  Diagnosis Date  . Diabetes mellitus 2/09  . Sleep apnea     no treatment, pt denies diagnosis  . Psoriatic arthritis     [facility] started on methotrexate 03/12/09, switch to enbrel aprox 2/11  . S/P cardiac cath   . Skin cancer     remotely, 4th L finger, not melanoma  . Testosterone deficiency   . Hyperlipidemia   . Hypertension   . Kidney stone    Past Surgical History  Procedure Laterality Date  . Appendectomy  1993  . Tonsillectomy  1960  . Knee surgery  1969    left  . Shoulder surgery  2008      Review of Systems Denies fever or chills Denies cough, chest pain, difficulty breathing, he has mild sputum production which is normal for him this time of year. No abdominal pain, flank pain, nausea, vomiting. No dysuria or gross hematuria No headaches or rashes Appetite fluids and food intake normal    Objective:   Physical Exam  BP 129/84  Pulse 90  Temp(Src) 98 F (36.7 C)  Wt 244 lb (110.678 kg)  SpO2 97% General -- alert, well-developed, NAD.   HEENT-- Not pale or jaundice Lungs -- normal respiratory effort, no intercostal retractions, no accessory muscle use, and normal breath sounds.  Heart-- normal rate, regular rhythm, no murmur.  Abdomen-- Not distended, good bowel sounds,soft, non-tender. No rebound or rigidity. No mass,organomegaly.  Extremities-- no pretibial edema bilaterally  Neurologic--  alert & oriented X3. Speech normal, gait normal, strength normal in all extremities.    Psych-- Cognition and judgment appear intact. Cooperative with normal  attention span and concentration. No anxious or depressed appearing.      Assessment & Plan:  Leukocytosis, hypotension, elevated creatinine: Since the last time he was here, he is doing well. He is holding Benicar, invokana and metformin His BP is improving and he feels well. By history and physical examination he does not has a  obvious infection. He is not dehydrated. His BP may have been low from overtreatment. Plan:  Repeat labs: CMP, CBC, UA Continue monitoring he is BPs and CBGs, see instructions Further advice would results Cont holding meds

## 2013-04-19 NOTE — Progress Notes (Signed)
Pre visit review using our clinic review tool, if applicable. No additional management support is needed unless otherwise documented below in the visit note. 

## 2013-04-19 NOTE — Patient Instructions (Signed)
Continue holding invokana, metformin,Benicar Call me or Dr. Debbora Presto if your blood sugars go over 180 Continue monitoring your blood pressure and  communicate with me every one or 2 days. At some point  we may need to reintroduce some of the medicines. If you have fever, chills, rash, a headache: Let me know

## 2013-04-20 ENCOUNTER — Telehealth: Payer: Self-pay

## 2013-04-20 NOTE — Telephone Encounter (Signed)
Relevant patient education assigned to patient using Emmi. ° °

## 2013-04-25 ENCOUNTER — Ambulatory Visit
Admission: RE | Admit: 2013-04-25 | Discharge: 2013-04-25 | Disposition: A | Discharge status: Home / Self Care | Payer: BC Managed Care – PPO | Source: Ambulatory Visit | Attending: Internal Medicine | Admitting: Internal Medicine

## 2013-04-25 DIAGNOSIS — E119 Type 2 diabetes mellitus without complications: Secondary | ICD-10-CM

## 2013-04-30 ENCOUNTER — Encounter: Payer: Self-pay | Admitting: Internal Medicine

## 2013-04-30 NOTE — Telephone Encounter (Signed)
Please advise 

## 2013-05-01 ENCOUNTER — Other Ambulatory Visit: Payer: Self-pay | Admitting: Internal Medicine

## 2013-05-01 ENCOUNTER — Encounter: Payer: Self-pay | Admitting: Internal Medicine

## 2013-05-24 ENCOUNTER — Ambulatory Visit: Payer: BC Managed Care – PPO | Admitting: Internal Medicine

## 2013-06-18 ENCOUNTER — Telehealth: Payer: Self-pay

## 2013-06-18 NOTE — Telephone Encounter (Signed)
Medication List and allergies:  Reviewed and updated (has been taking amlodipine in addition to [place])  90 day supply/mail order: [facility] Local prescriptions: [facility] [address] [place] [place]  Immunizations due: UTD  A/P:   No changes to FH, PSH or Personal Hx Flu vaccine--12/2012 Tdap--11/2008 PNA--06/2012 Shingles--06/2012 [facility]--08/2012--Dr Pyrtle--adenomatous polyps--next due 2017 PSA--06/2012--0.41  To Discuss with Provider: Refills for the year When are you taking him to dinner?

## 2013-06-19 ENCOUNTER — Encounter: Payer: Self-pay | Admitting: Internal Medicine

## 2013-06-19 ENCOUNTER — Ambulatory Visit (INDEPENDENT_AMBULATORY_CARE_PROVIDER_SITE_OTHER): Payer: BC Managed Care – PPO | Admitting: Internal Medicine

## 2013-06-19 VITALS — BP 113/83 | HR 100 | Temp 97.6°F | Ht 73.0 in | Wt 246.0 lb

## 2013-06-19 DIAGNOSIS — I10 Essential (primary) hypertension: Secondary | ICD-10-CM

## 2013-06-19 DIAGNOSIS — E785 Hyperlipidemia, unspecified: Secondary | ICD-10-CM

## 2013-06-19 DIAGNOSIS — D518 Other vitamin B12 deficiency anemias: Secondary | ICD-10-CM

## 2013-06-19 DIAGNOSIS — Z0000 Encounter for general adult medical examination without abnormal findings: Secondary | ICD-10-CM

## 2013-06-19 LAB — COMPREHENSIVE METABOLIC PANEL
ALK PHOS: 52 U/L (ref 39–117)
ALT: 50 U/L (ref 0–53)
AST: 44 U/L — AB (ref 0–37)
Albumin: 4.8 g/dL (ref 3.5–5.2)
BUN: 13 mg/dL (ref 6–23)
CALCIUM: 10.1 mg/dL (ref 8.4–10.5)
CHLORIDE: 105 meq/L (ref 96–112)
CO2: 21 mEq/L (ref 19–32)
CREATININE: 1 mg/dL (ref 0.4–1.5)
GFR: 80.3 mL/min (ref >60.00)
Glucose, Bld: 183 mg/dL — ABNORMAL HIGH (ref 70–99)
Potassium: 4.6 mEq/L (ref 3.5–5.1)
Sodium: 140 mEq/L (ref 135–145)
Total Bilirubin: 1.4 mg/dL — ABNORMAL HIGH (ref 0.3–1.2)
Total Protein: 8.3 g/dL (ref 6.0–8.3)

## 2013-06-19 LAB — PSA: PSA: 0.53 ng/mL (ref 0.10–4.00)

## 2013-06-19 LAB — LIPID PANEL
CHOL/HDL RATIO: 5
Cholesterol: 208 mg/dL — ABNORMAL HIGH (ref 0–200)
HDL: 45.3 mg/dL (ref >39.00)
LDL CALC: 117 mg/dL — AB (ref 0–99)
TRIGLYCERIDES: 230 mg/dL — AB (ref 0.0–149.0)
VLDL: 46 mg/dL — AB (ref 0.0–40.0)

## 2013-06-19 LAB — VITAMIN B12: VITAMIN B 12: 284 pg/mL (ref 211–911)

## 2013-06-19 NOTE — Assessment & Plan Note (Addendum)
Since last time he was here, kidney function returned to normal. Systolic BP is within normal, diastolic BPs sometimes in the 90s. He was taking Benicar half tablet daily, two days ago pt added amlodipine because he was concerned about his diastolic BP. Plan: Continue amlodipine, but decrease amlodipine  from 10 mg to 5, monitor BP Pulse today  around 100  , in the ambulatory setting is usually in the 90s, patient had some job related  stress just before he arrived to the office

## 2013-06-19 NOTE — Assessment & Plan Note (Signed)
Off lovaza since November 2014, recheck labs, patient is willing to go back on lovaza if needed

## 2013-06-19 NOTE — Assessment & Plan Note (Addendum)
TD 8- 2010 Pneumonia shot 2014 zostavax -- done 2014, see previous entry [facility]--08/2012--Dr Pyrtle--adenomatous polyps--next due 2017  Diet-exercise discussed Labs  Strongly recommend patient to see the eye doctor given  diabetes are family history of glaucoma. + Family history of heart disease, controlling cardiovascular risk factors

## 2013-06-19 NOTE — Patient Instructions (Signed)
Get your blood work before you leave    Next visit is for routine check up regards your  blood pressure    in 6 months , fasting Please make an appointment    Check the  blood pressure 2 or 3 times a   week be sure it is between 110/60 and 140/85. Ideal blood pressure is 120/80. If it is consistently higher or lower, let me know

## 2013-06-19 NOTE — Progress Notes (Signed)
Pre visit review using our clinic review tool, if applicable. No additional management support is needed unless otherwise documented below in the visit note. 

## 2013-06-19 NOTE — Progress Notes (Signed)
Subjective:    Patient ID: Samuel Meyer, male    DOB: 1951/04/01, 63 y.o.   MRN: [id number]  DOS:  06/19/2013 Type of  visit: CPX  ROS Diet-- usually ok, not too good last 2 weeks  Exercise-- very active, remodeling a house  DM--- + LE paresthesias , (-) visual disturbances  No  CP, SOB Denies  nausea, vomiting diarrhea Denies  blood in the stools (-) cough, sputum production (-) wheezing, chest congestion No dysuria, gross hematuria, difficulty urinating   No anxiety, depression    Past Medical History  Diagnosis Date  . Diabetes mellitus 2/09  . Sleep apnea     no treatment, pt denies diagnosis  . Psoriatic arthritis     [facility] started on methotrexate 03/12/09, switch to enbrel aprox 2/11  . S/P cardiac cath   . Skin cancer     remotely, 4th L finger, not melanoma  . Testosterone deficiency   . Hyperlipidemia   . Hypertension   . Kidney stone     Past Surgical History  Procedure Laterality Date  . Appendectomy  1993  . Tonsillectomy  1960  . Knee surgery  1969    left  . Shoulder surgery  2008    History   Social History  . Marital Status: Married    Spouse Name: N/A    Number of Children: 1  . Years of Education: N/A   Occupational History  . works on  Librarian, academic    Social History Main Topics  . Smoking status: Former Smoker    Quit date: 04/12/1978  . Smokeless tobacco: Never Used  . Alcohol Use: No  . Drug Use: No  . Sexual Activity: Not on file   Other Topics Concern  . Not on file   Social History Narrative  . No narrative on file     Family History  Problem Relation Age of Onset  . Stroke Father     cerebral hemorrhage, stroke x 2  . Hypertension Mother   . Glaucoma Other     siblings  . Asthma Other     GM  . Lung cancer Other     GM  . Prostate cancer Neg Hx   . Colon cancer Neg Hx   . Heart attack Father     first CABG at age 77  . Diabetes Father        Medication List       This list is accurate as  of: 06/19/13  7:10 PM.  Always use your most recent med list.               amLODipine 10 MG tablet  Commonly known as:  NORVASC  Take 5 mg by mouth daily.     BENICAR 40 MG tablet  Generic drug:  olmesartan  TAKE 1/2 TABLET (40 MG TOTAL) BY MOUTH DAILY.     ENBREL 50 MG/ML injection  Generic drug:  etanercept  Inject 50 mg into the skin once a week.     ergocalciferol 50000 UNITS capsule  Commonly known as:  VITAMIN D2  Take 1 capsule (50,000 Units total) by mouth once a week.     glimepiride 4 MG tablet  Commonly known as:  AMARYL  Take 4 mg by mouth 2 (two) times daily.     Glucose Blood Disk  Commonly known as:  BAYER BREEZE 2 TEST  Check once daily as directed.     INVOKANA PO  Take  by mouth daily.     Lecithin 1200 MG Caps  Take 1 capsule (1,200 mg total) by mouth 2 (two) times daily. PRN Only.     metFORMIN 1000 MG tablet  Commonly known as:  GLUCOPHAGE  TAKE 1.5 TABLET BY MOUTH TWICE A DAY           Objective:   Physical Exam BP 113/83  Pulse 100  Temp(Src) 97.6 F (36.4 C)  Ht 6\' 1"  (1.854 m)  Wt 246 lb (111.585 kg)  BMI 32.46 kg/m2  SpO2 94%  General -- alert, well-developed, NAD.  Neck --no thyromegaly , normal carotid pulse  HEENT-- Not pale.   Lungs -- normal respiratory effort, no intercostal retractions, no accessory muscle use, and normal breath sounds.  Heart-- normal rate, HR ~ 100, regular rhythm, no murmur. Abdomen-- Not distended, good bowel sounds,soft, non-tender. Rectal-- No external abnormalities noted. Normal sphincter tone. No rectal masses or tenderness. Brown stool  Prostate--Prostate gland firm and smooth, no enlargement, nodularity, tenderness, mass, asymmetry or induration. Extremities-- no pretibial edema bilaterally  Neurologic--  alert & oriented X3. Speech normal, gait normal, strength normal in all extremities.  Psych-- Cognition and judgment appear intact. Cooperative with normal attention span and concentration. No  anxious or depressed appearing.       Assessment & Plan:

## 2013-06-20 ENCOUNTER — Telehealth: Payer: Self-pay | Admitting: Internal Medicine

## 2013-06-20 NOTE — Telephone Encounter (Signed)
Relevant patient education assigned to patient using Emmi. ° °

## 2013-06-21 ENCOUNTER — Other Ambulatory Visit: Payer: Self-pay | Admitting: *Deleted

## 2013-06-21 MED ORDER — OLMESARTAN MEDOXOMIL 20 MG PO TABS: 20.0000 mg | ORAL_TABLET | Freq: Every day | ORAL | Status: DC

## 2013-06-21 MED ORDER — METFORMIN HCL 1000 MG PO TABS: ORAL_TABLET | ORAL | Status: DC

## 2013-06-21 MED ORDER — OMEGA-3-ACID ETHYL ESTERS 1 G PO CAPS: 1.0000 g | ORAL_CAPSULE | Freq: Two times a day (BID) | ORAL | Status: DC

## 2013-06-21 MED ORDER — LECITHIN 1200 MG PO CAPS: 1200.0000 mg | ORAL_CAPSULE | Freq: Two times a day (BID) | ORAL | Status: DC

## 2013-06-21 MED ORDER — AMLODIPINE BESYLATE 5 MG PO TABS
5.0000 mg | ORAL_TABLET | Freq: Every day | ORAL | Status: DC
Start: 2013-06-21 — End: 2014-01-04

## 2013-06-21 MED ORDER — GLIMEPIRIDE 4 MG PO TABS: ORAL_TABLET | ORAL | Status: DC

## 2013-06-28 NOTE — Addendum Note (Signed)
Addended by: Peggyann Shoals on: 06/28/2013 02:54 PM   Modules accepted: Orders

## 2013-10-02 ENCOUNTER — Ambulatory Visit (INDEPENDENT_AMBULATORY_CARE_PROVIDER_SITE_OTHER): Payer: BC Managed Care – PPO | Admitting: *Deleted

## 2013-10-02 DIAGNOSIS — Z23 Encounter for immunization: Secondary | ICD-10-CM

## 2013-10-10 IMAGING — US US ABDOMEN COMPLETE
1 series · 14 of 25 positions shown · non-contrast
Comparison: CT abdomen pelvis 04/09/2008.

CLINICAL DATA: Elevated liver function tests.

COMPLETE ABDOMINAL ULTRASOUND

[Series 1: us abdomen complete · 0.33mm/px · 14 of 88 slices shown]
[im 1/88]
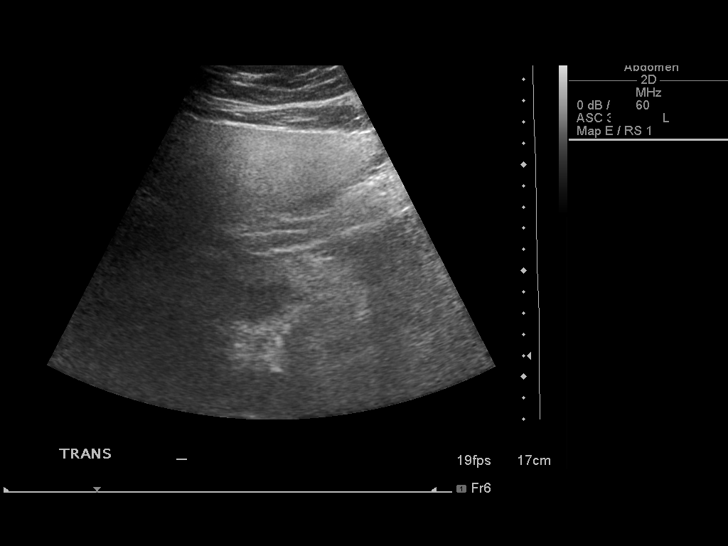
[im 8/88]
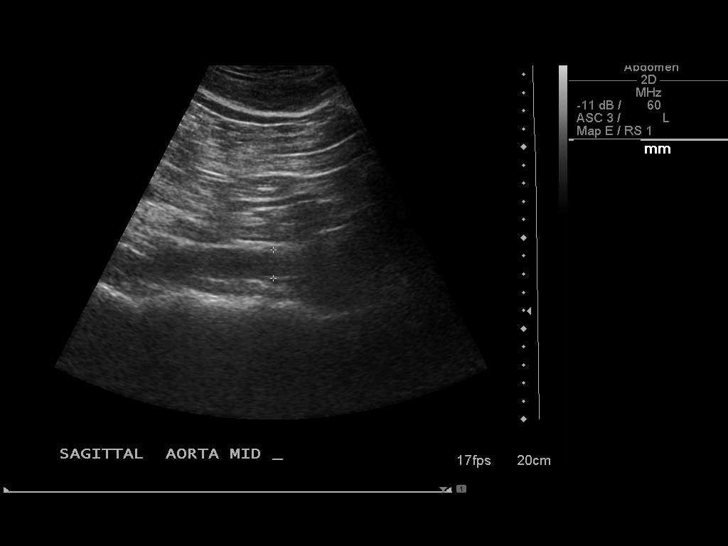
[im 15/88]
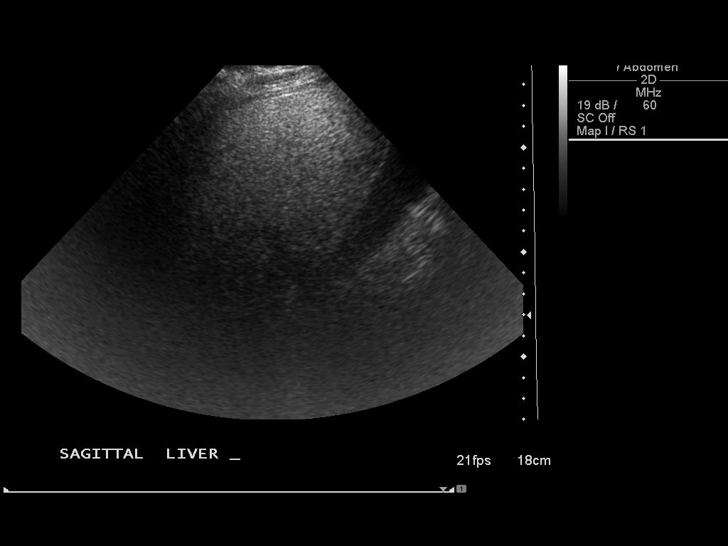
[im 22/88]
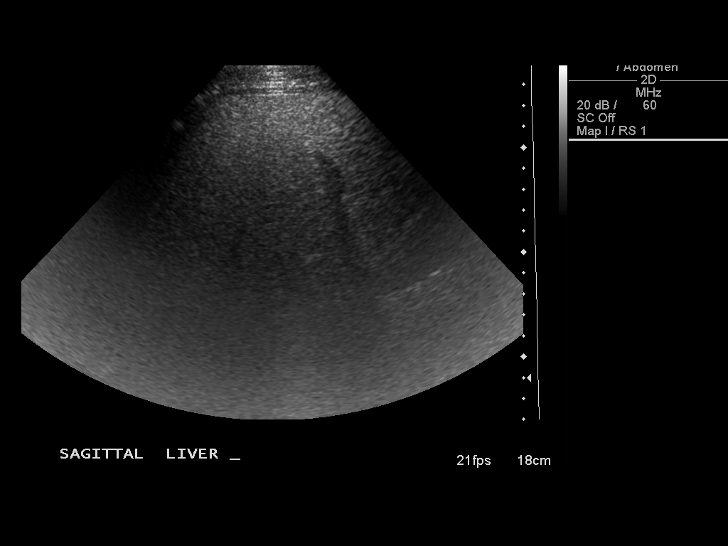
[im 30/88]
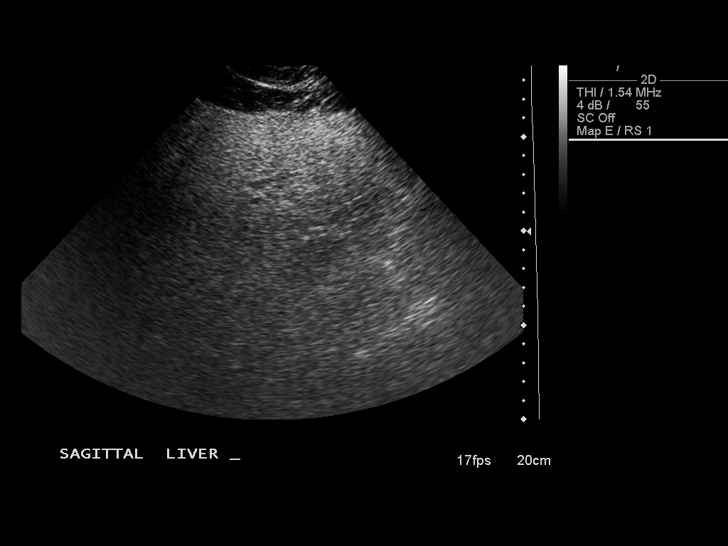
[im 33/88]
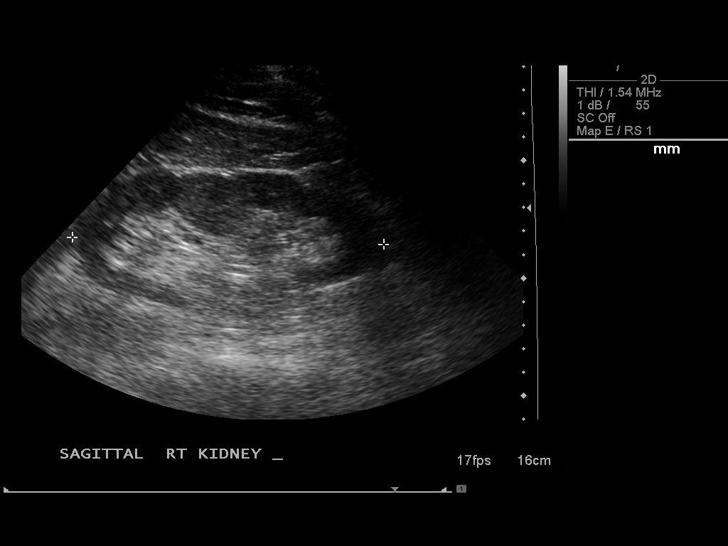
[im 40/88]
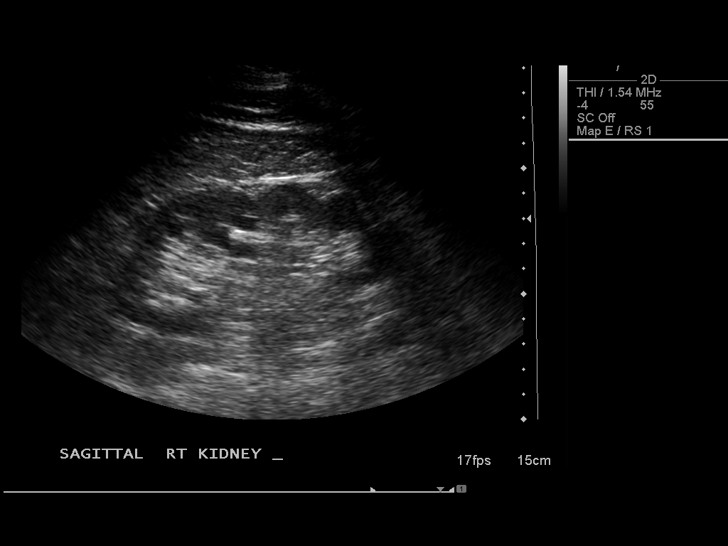
[im 48/88]
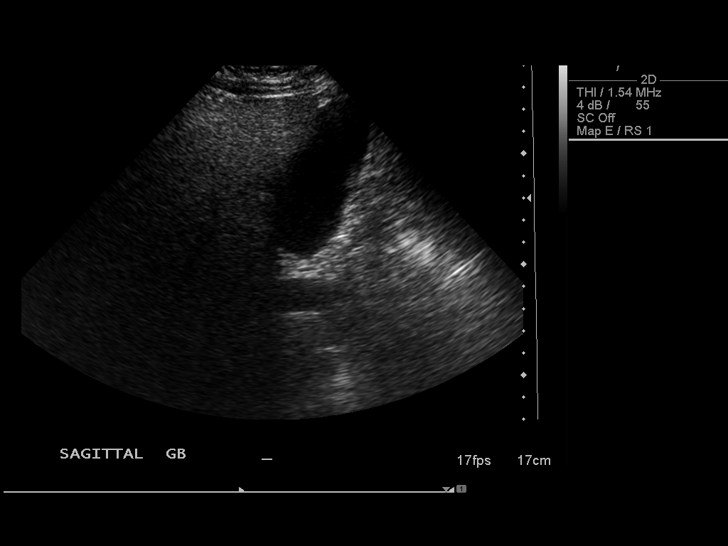
[im 55/88]
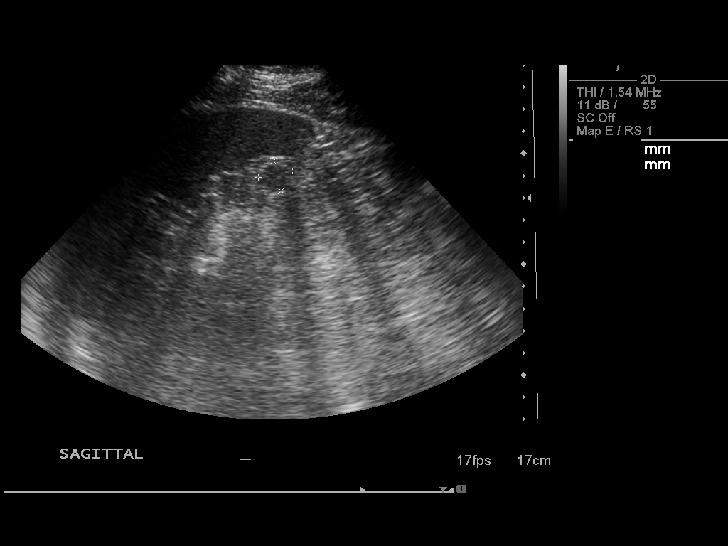
[im 59/88]
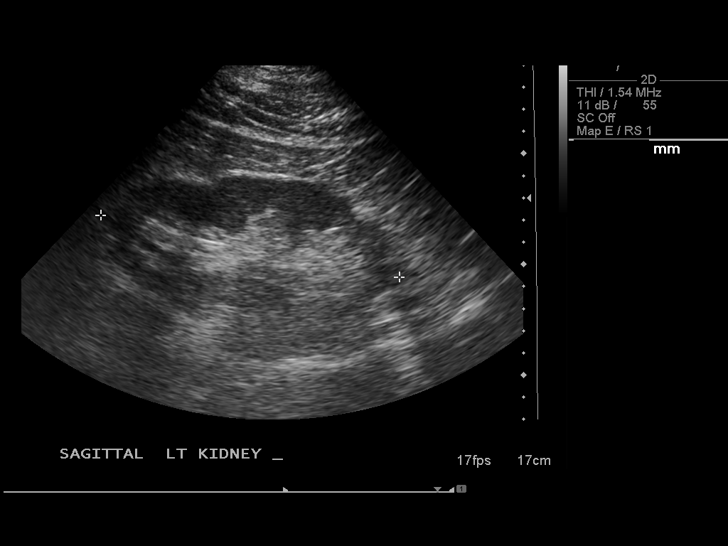
[im 66/88]
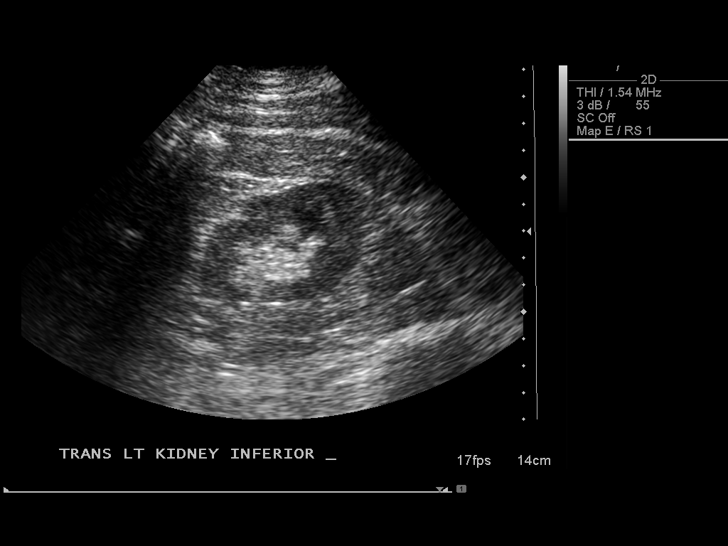
[im 73/88]
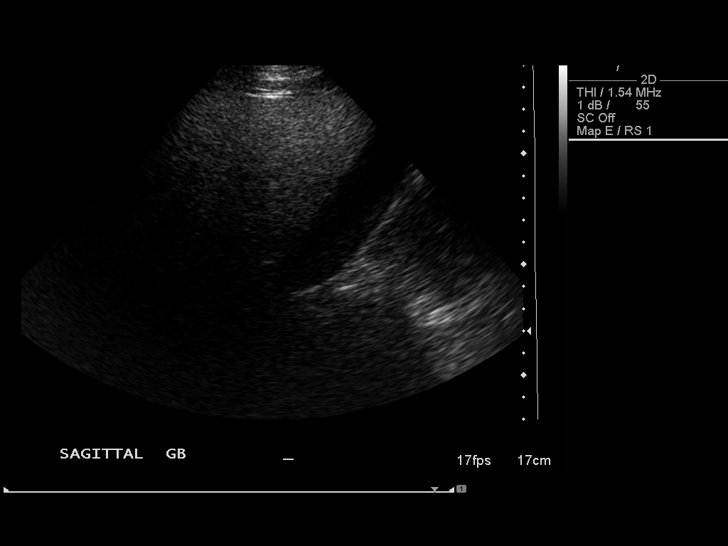
[im 80/88]
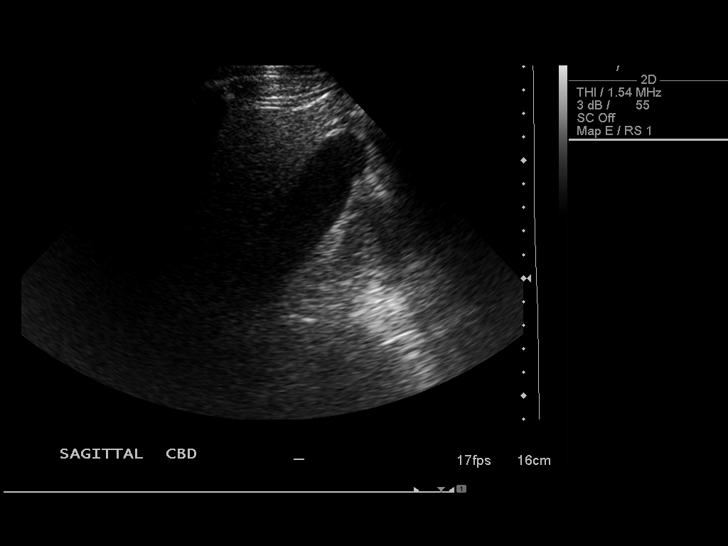
[im 88/88]
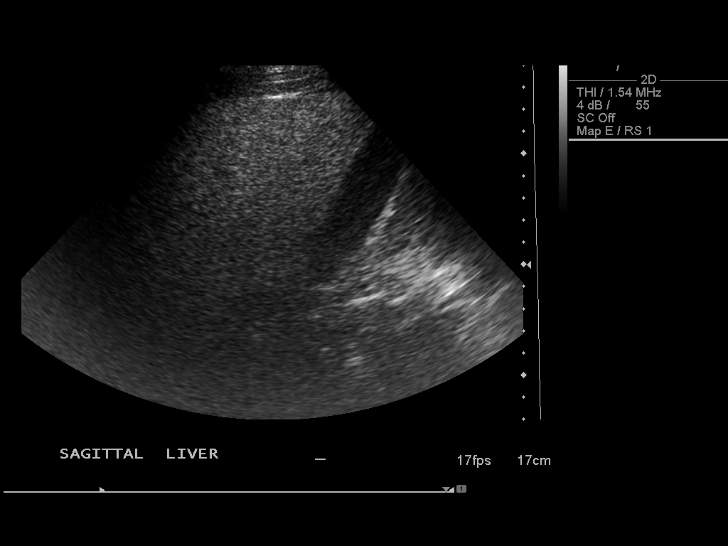

[14 of 25 positions shown; findings below may reference images not displayed]

FINDINGS: Gallbladder:  No gallstones, gallbladder wall thickening, or
pericholecystic fluid.

Common bile duct:  Measures 4 mm, within normal limits.

Liver:  Increase in echogenicity diffusely.

IVC:  Appears normal.

Pancreas:  Rather obscured by bowel gas.

Spleen:  Measures 9.8 cm, negative.

Right Kidney:  Measures 13.2 cm.  Parenchymal echogenicity is
normal.  An echogenic lesion with posterior acoustic shadowing
measures 8 mm.  No obstruction.

Left Kidney:  Measures 13.8 cm.  Parenchymal echogenicity is
normal.  No hydronephrosis.  No focal lesion.

Abdominal aorta:  No aneurysm identified.
IMPRESSION: 1.  Hepatic steatosis.
2.  Right renal stone.

## 2013-12-04 ENCOUNTER — Encounter: Payer: Self-pay | Admitting: Internal Medicine

## 2013-12-04 ENCOUNTER — Other Ambulatory Visit: Payer: Self-pay | Admitting: Internal Medicine

## 2013-12-04 MED ORDER — LOSARTAN POTASSIUM 100 MG PO TABS: 100.0000 mg | ORAL_TABLET | Freq: Every day | ORAL | Status: DC

## 2013-12-20 ENCOUNTER — Ambulatory Visit: Payer: BC Managed Care – PPO | Admitting: Internal Medicine

## 2014-01-01 ENCOUNTER — Ambulatory Visit (INDEPENDENT_AMBULATORY_CARE_PROVIDER_SITE_OTHER): Payer: BC Managed Care – PPO | Admitting: Internal Medicine

## 2014-01-01 ENCOUNTER — Encounter: Payer: Self-pay | Admitting: Internal Medicine

## 2014-01-01 VITALS — BP 122/82 | HR 89 | Temp 97.6°F | Wt 243.1 lb

## 2014-01-01 DIAGNOSIS — E785 Hyperlipidemia, unspecified: Secondary | ICD-10-CM

## 2014-01-01 DIAGNOSIS — L408 Other psoriasis: Secondary | ICD-10-CM

## 2014-01-01 DIAGNOSIS — Z23 Encounter for immunization: Secondary | ICD-10-CM

## 2014-01-01 DIAGNOSIS — E119 Type 2 diabetes mellitus without complications: Secondary | ICD-10-CM

## 2014-01-01 DIAGNOSIS — I10 Essential (primary) hypertension: Secondary | ICD-10-CM

## 2014-01-01 LAB — LIPID PANEL
CHOL/HDL RATIO: 4
Cholesterol: 188 mg/dL (ref 0–200)
HDL: 43.3 mg/dL (ref >39.00)
LDL CALC: 114 mg/dL — AB (ref 0–99)
NONHDL: 144.7
Triglycerides: 153 mg/dL — ABNORMAL HIGH (ref 0.0–149.0)
VLDL: 30.6 mg/dL (ref 0.0–40.0)

## 2014-01-01 LAB — BASIC METABOLIC PANEL
BUN: 12 mg/dL (ref 6–23)
CO2: 26 meq/L (ref 19–32)
Calcium: 9.4 mg/dL (ref 8.4–10.5)
Chloride: 103 mEq/L (ref 96–112)
Creatinine, Ser: 1 mg/dL (ref 0.4–1.5)
GFR: 84.02 mL/min (ref >60.00)
GLUCOSE: 128 mg/dL — AB (ref 70–99)
Potassium: 4.3 mEq/L (ref 3.5–5.1)
Sodium: 136 mEq/L (ref 135–145)

## 2014-01-01 LAB — SEDIMENTATION RATE: SED RATE: 5 mm/h (ref 0–22)

## 2014-01-01 NOTE — Assessment & Plan Note (Addendum)
Currently on amlodipine 5 mg ,  losartan 100 mg. Great BP control, check a BMP

## 2014-01-01 NOTE — Progress Notes (Signed)
Subjective:    Patient ID: Samuel Meyer, male    DOB: November 17, 1950, 63 y.o.   MRN: [id number]  DOS:  01/01/2014 Type of visit - description : Followup from previous visit Interval history: Hypertension, medications were changes, good compliance, ambulatory BPs in the 120s High cholesterol, based on the last cholesterol panel he was recommended to go back on lovaza, he didn't, would like his cholesterol checked.  ROS  Denies chest pain or difficulty breathing No nausea, vomiting, diarrhea  Past Medical History  Diagnosis Date  . Diabetes mellitus 2/09  . Sleep apnea     no treatment, pt denies diagnosis  . Psoriatic arthritis     [facility] started on methotrexate 03/12/09, switch to enbrel aprox 2/11  . S/P cardiac cath   . Skin cancer     remotely, 4th L finger, not melanoma  . Testosterone deficiency   . Hyperlipidemia   . Hypertension   . Kidney stone     Past Surgical History  Procedure Laterality Date  . Appendectomy  1993  . Tonsillectomy  1960  . Knee surgery  1969    left  . Shoulder surgery  2008    History   Social History  . Marital Status: Married    Spouse Name: N/A    Number of Children: 1  . Years of Education: N/A   Occupational History  . works on  Librarian, academic    Social History Main Topics  . Smoking status: Former Smoker    Quit date: 04/12/1978  . Smokeless tobacco: Never Used  . Alcohol Use: No  . Drug Use: No  . Sexual Activity: Not on file   Other Topics Concern  . Not on file   Social History Narrative  . No narrative on file        Medication List       This list is accurate as of: 01/01/14 11:59 PM.  Always use your most recent med list.               amLODipine 5 MG tablet  Commonly known as:  NORVASC  Take 1 tablet (5 mg total) by mouth daily.     ENBREL 50 MG/ML injection  Generic drug:  etanercept  Inject 50 mg into the skin once a week.     glimepiride 4 MG tablet  Commonly known as:  AMARYL  Take 4  mg by mouth 2 (two) times daily.     Glucose Blood Disk  Commonly known as:  BAYER BREEZE 2 TEST  Check once daily as directed.     INVOKANA 100 MG Tabs  Generic drug:  Canagliflozin  Take 100 mg by mouth daily.     Lecithin 1200 MG Caps  Take 1 capsule (1,200 mg total) by mouth 2 (two) times daily. PRN Only.     losartan 100 MG tablet  Commonly known as:  COZAAR  Take 100 mg by mouth daily.     metFORMIN 1000 MG tablet  Commonly known as:  GLUCOPHAGE  TAKE 1.5 TABLET BY MOUTH TWICE A DAY     VITAMIN D2 PO  Take 1 tablet by mouth daily.           Objective:   Physical Exam BP 122/82  Pulse 89  Temp(Src) 97.6 F (36.4 C) (Oral)  Wt 243 lb 2 oz (110.281 kg)  SpO2 97%  General -- alert, well-developed, NAD.  Lungs -- normal respiratory effort, no intercostal retractions, no accessory  muscle use, and normal breath sounds.  Heart-- normal rate, regular rhythm, no murmur.   Extremities-- no pretibial edema bilaterally  Neurologic--  alert & oriented X3. Speech normal, gait appropriate for age, strength symmetric and appropriate for age.  Psych-- Cognition and judgment appear intact. Cooperative with normal attention span and concentration. No anxious or depressed appearing.       Assessment & Plan:   Flu shot today

## 2014-01-01 NOTE — Assessment & Plan Note (Signed)
Request a sedimentation rate and fax results to Dr. Amil Amen

## 2014-01-01 NOTE — Assessment & Plan Note (Signed)
Based on last FLP  recommended lovaza, he declined, he will try to improve diet and exercise. Request for FLP check which will do.

## 2014-01-01 NOTE — Assessment & Plan Note (Addendum)
Managed by endocrinology, reports  last A1c was 7.4. Has not seen the ophthalmologist, again recommend an eye check

## 2014-01-01 NOTE — Patient Instructions (Signed)
Get your blood work before you leave   Please come back to the office by 06-2014 for a physical exam. Come back fasting    

## 2014-01-01 NOTE — Progress Notes (Signed)
Pre visit review using our clinic review tool, if applicable. No additional management support is needed unless otherwise documented below in the visit note. 

## 2014-01-03 ENCOUNTER — Encounter: Payer: Self-pay | Admitting: Internal Medicine

## 2014-01-04 ENCOUNTER — Encounter: Payer: Self-pay | Admitting: Internal Medicine

## 2014-01-04 ENCOUNTER — Other Ambulatory Visit: Payer: Self-pay

## 2014-01-04 MED ORDER — LOSARTAN POTASSIUM 100 MG PO TABS
100.0000 mg | ORAL_TABLET | Freq: Every day | ORAL | Status: DC
Start: 2014-01-04 — End: 2014-03-04

## 2014-01-04 MED ORDER — AMLODIPINE BESYLATE 5 MG PO TABS: 5.0000 mg | ORAL_TABLET | Freq: Every day | ORAL | Status: DC

## 2014-02-08 ENCOUNTER — Other Ambulatory Visit: Payer: Self-pay | Admitting: Internal Medicine

## 2014-03-04 ENCOUNTER — Other Ambulatory Visit: Payer: Self-pay

## 2014-07-01 ENCOUNTER — Telehealth: Payer: Self-pay

## 2014-07-01 NOTE — Telephone Encounter (Signed)
Spoke with wife --verified he would be at appt--will being info to appt

## 2014-07-02 ENCOUNTER — Ambulatory Visit (INDEPENDENT_AMBULATORY_CARE_PROVIDER_SITE_OTHER): Payer: BLUE CROSS/BLUE SHIELD | Admitting: Internal Medicine

## 2014-07-02 ENCOUNTER — Encounter: Payer: Self-pay | Admitting: Internal Medicine

## 2014-07-02 VITALS — BP 124/66 | HR 99 | Temp 97.4°F | Ht 73.0 in | Wt 249.0 lb

## 2014-07-02 DIAGNOSIS — E785 Hyperlipidemia, unspecified: Secondary | ICD-10-CM | POA: Diagnosis not present

## 2014-07-02 DIAGNOSIS — Z0000 Encounter for general adult medical examination without abnormal findings: Secondary | ICD-10-CM

## 2014-07-02 DIAGNOSIS — Z23 Encounter for immunization: Secondary | ICD-10-CM

## 2014-07-02 LAB — CBC
HCT: 50.4 % (ref 39.0–52.0)
HEMOGLOBIN: 17.3 g/dL — AB (ref 13.0–17.0)
MCHC: 34.3 g/dL (ref 30.0–36.0)
MCV: 91.2 fl (ref 78.0–100.0)
Platelets: 217 10*3/uL (ref 150.0–400.0)
RBC: 5.53 Mil/uL (ref 4.22–5.81)
RDW: 12.8 % (ref 11.5–15.5)
WBC: 6.9 10*3/uL (ref 4.0–10.5)

## 2014-07-02 LAB — BASIC METABOLIC PANEL
BUN: 14 mg/dL (ref 6–23)
CALCIUM: 9.6 mg/dL (ref 8.4–10.5)
CO2: 23 mEq/L (ref 19–32)
Chloride: 104 mEq/L (ref 96–112)
Creatinine, Ser: 1.01 mg/dL (ref 0.40–1.50)
GFR: 79.11 mL/min (ref >60.00)
Glucose, Bld: 150 mg/dL — ABNORMAL HIGH (ref 70–99)
Potassium: 4.6 mEq/L (ref 3.5–5.1)
SODIUM: 137 meq/L (ref 135–145)

## 2014-07-02 LAB — LIPID PANEL
CHOL/HDL RATIO: 4
Cholesterol: 167 mg/dL (ref 0–200)
HDL: 40.4 mg/dL (ref >39.00)
LDL Cholesterol: 96 mg/dL (ref 0–99)
NonHDL: 126.6
Triglycerides: 152 mg/dL — ABNORMAL HIGH (ref 0.0–149.0)
VLDL: 30.4 mg/dL (ref 0.0–40.0)

## 2014-07-02 LAB — AST: AST: 24 U/L (ref 0–37)

## 2014-07-02 LAB — ALT: ALT: 42 U/L (ref 0–53)

## 2014-07-02 LAB — PSA: PSA: 0.37 ng/mL (ref 0.10–4.00)

## 2014-07-02 NOTE — Assessment & Plan Note (Addendum)
TD 8- 2010 Pneumonia shot 2014 prevnar --today zostavax -- done 2014   [facility]--08/2012--Dr Pyrtle--adenomatous polyps--next due 2017   Diet-exercise discussed Encourage to do an eye exam Labs including a PSA and a cholesterol panel per patient request Return to the clinic in 6 months, fasting.  Other issues: Hypertension, well controlled without taking amlodipine Diabetes follow-up by endocrinology He sees rheumatology regularly High cholesterol, based on the last FLP declined to take Lovaza. Recheck on return to the office

## 2014-07-02 NOTE — Progress Notes (Signed)
Pre visit review using our clinic review tool, if applicable. No additional management support is needed unless otherwise documented below in the visit note. 

## 2014-07-02 NOTE — Progress Notes (Signed)
Subjective:    Patient ID: Samuel Meyer, male    DOB: 03/02/1951, 64 y.o.   MRN: [id number]  DOS:  07/02/2014 Type of visit - description : cpx Interval history: In general feeling well, not taking amlodipine, ambulatory BPs are good.    Review of Systems  Constitutional: No fever, chills. No unexplained wt changes. No unusual sweats HEENT: No dental problems, ear discharge, facial swelling, voice changes. No eye discharge, redness or intolerance to light Respiratory: No wheezing or difficulty breathing. No cough , mucus production Cardiovascular: No CP, leg swelling or palpitations GI: no nausea, vomiting, diarrhea or abdominal pain.  No blood in the stools. No dysphagia   Endocrine: No polyphagia, polyuria or polydipsia GU: No dysuria, gross hematuria, difficulty urinating. No urinary urgency or frequency. Musculoskeletal: No joint swellings or unusual aches or pains Skin: No change in the color of the skin, palor or rash Allergic, immunologic: No environmental allergies or food allergies Neurological: No dizziness or syncope. No headaches. No diplopia, slurred speech, motor deficits, facial numbness Hematological: No enlarged lymph nodes, easy bruising or bleeding Psychiatry: No suicidal ideas, hallucinations, behavior problems or confusion. No unusual/severe anxiety or depression.    Past Medical History  Diagnosis Date  . Diabetes mellitus 2/09  . Sleep apnea     no treatment, pt denies diagnosis  . Psoriatic arthritis     [facility] started on methotrexate 03/12/09, switch to enbrel aprox 2/11  . S/P cardiac cath   . Skin cancer     remotely, 4th L finger, not melanoma  . Testosterone deficiency   . Hyperlipidemia   . Hypertension   . Kidney stone     Past Surgical History  Procedure Laterality Date  . Appendectomy  1993  . Tonsillectomy  1960  . Knee surgery  1969    left  . Shoulder surgery  2008    History   Social History  . Marital Status: Married   Spouse Name: N/A  . Number of Children: 1  . Years of Education: N/A   Occupational History  . works on  Librarian, academic    Social History Main Topics  . Smoking status: Former Smoker    Quit date: 04/12/1978  . Smokeless tobacco: Never Used  . Alcohol Use: No  . Drug Use: No  . Sexual Activity: Not on file   Other Topics Concern  . Not on file   Social History Narrative   Household--pt and wife        Medication List       This list is accurate as of: 07/02/14 11:59 PM.  Always use your most recent med list.               ENBREL 50 MG/ML injection  Generic drug:  etanercept  Inject 50 mg into the skin once a week.     glimepiride 4 MG tablet  Commonly known as:  AMARYL  Take 4 mg by mouth 2 (two) times daily.     Glucose Blood Disk  Commonly known as:  BAYER BREEZE 2 TEST  Check once daily as directed.     INVOKANA 100 MG Tabs tablet  Generic drug:  canagliflozin  Take 100 mg by mouth daily.     Lecithin 1200 MG Caps  Take 1 capsule (1,200 mg total) by mouth 2 (two) times daily. PRN Only.     losartan 100 MG tablet  Commonly known as:  COZAAR  TAKE 1 TABLET (100 MG  TOTAL) BY MOUTH DAILY.     metFORMIN 1000 MG tablet  Commonly known as:  GLUCOPHAGE  TAKE 1.5 TABLET BY MOUTH TWICE A DAY     VITAMIN D2 PO  Take 1 tablet by mouth daily.           Objective:   Physical Exam BP 124/66 mmHg  Pulse 99  Temp(Src) 97.4 F (36.3 C) (Oral)  Ht 6\' 1"  (1.854 m)  Wt 249 lb (112.946 kg)  BMI 32.86 kg/m2  SpO2 98% General:   Well developed, well nourished . NAD.  Neck:  Full range of motion. Supple. No  Thyromegaly  HEENT:  Normocephalic . Face symmetric, atraumatic Lungs:  CTA B Normal respiratory effort, no intercostal retractions, no accessory muscle use. Heart: RRR,  no murmur.  Abdomen:  Not distended, soft, non-tender. No rebound or rigidity. No mass,organomegaly, no bruit  Muscle skeletal: no pretibial edema bilaterally  Skin:  Exposed areas without rash. Not pale. Not jaundice Neurologic:  alert & oriented X3.  Speech normal, gait appropriate for age and unassisted Strength symmetric and appropriate for age.  Psych: Cognition and judgment appear intact.  Cooperative with normal attention span and concentration.  Behavior appropriate. No anxious or depressed appearing.      Assessment & Plan:

## 2014-07-02 NOTE — Patient Instructions (Signed)
Get your blood work before you leave     Come back to the office in 6 months   for a routine check up   Come back fasting

## 2014-07-05 ENCOUNTER — Other Ambulatory Visit: Payer: Self-pay | Admitting: Internal Medicine

## 2014-07-08 ENCOUNTER — Other Ambulatory Visit: Payer: Self-pay

## 2014-07-08 MED ORDER — GLIMEPIRIDE 4 MG PO TABS
ORAL_TABLET | ORAL | Status: DC
Start: 2014-07-08 — End: 2015-08-14

## 2014-07-08 MED ORDER — LOSARTAN POTASSIUM 100 MG PO TABS: 100.0000 mg | ORAL_TABLET | Freq: Every day | ORAL | Status: DC

## 2014-07-08 MED ORDER — METFORMIN HCL 1000 MG PO TABS: ORAL_TABLET | ORAL | Status: DC

## 2014-07-22 IMAGING — US US RENAL
1 series · 13 of 25 positions shown · non-contrast
Comparison: Abdominal ultrasound - 07/14/2012; abdominal CT
-04/09/2008

CLINICAL DATA: History of diabetes, elevated creatinine,
hypertension, evaluate for renal artery stenosis

EXAM:
RENAL/URINARY TRACT ULTRASOUND
RENAL DUPLEX ULTRASOUND

[Series 1: us renal · 0.52mm/px · 13 of 98 slices shown]
[im 1/98]
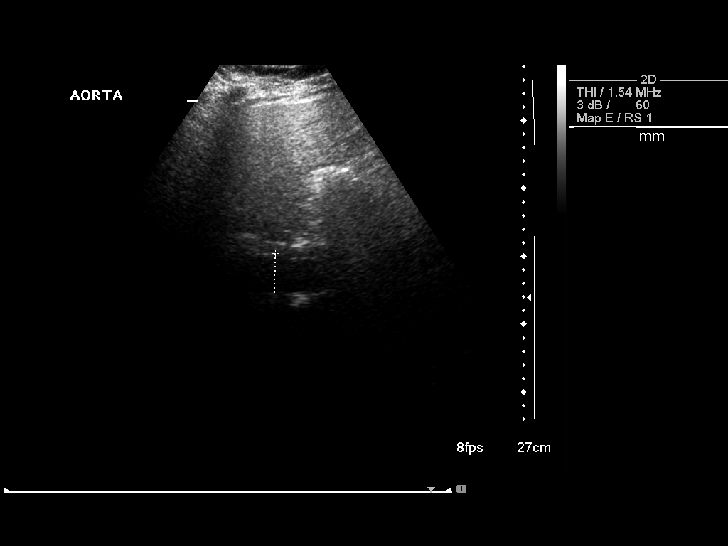
[im 9/98]
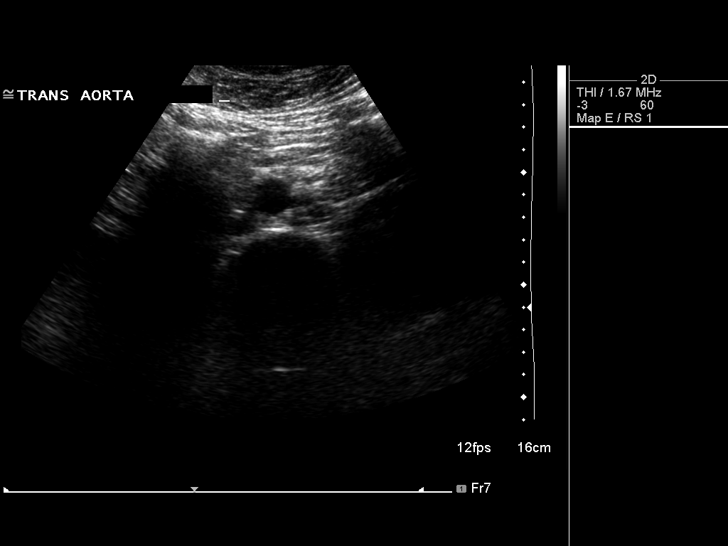
[im 17/98]
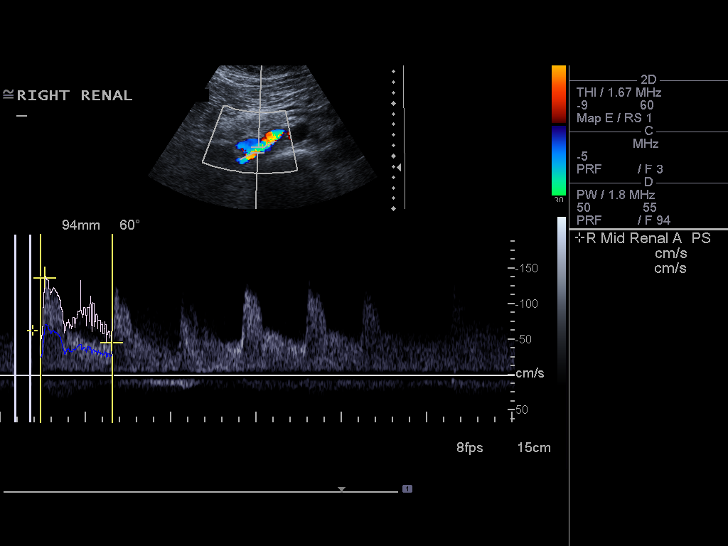
[im 25/98]
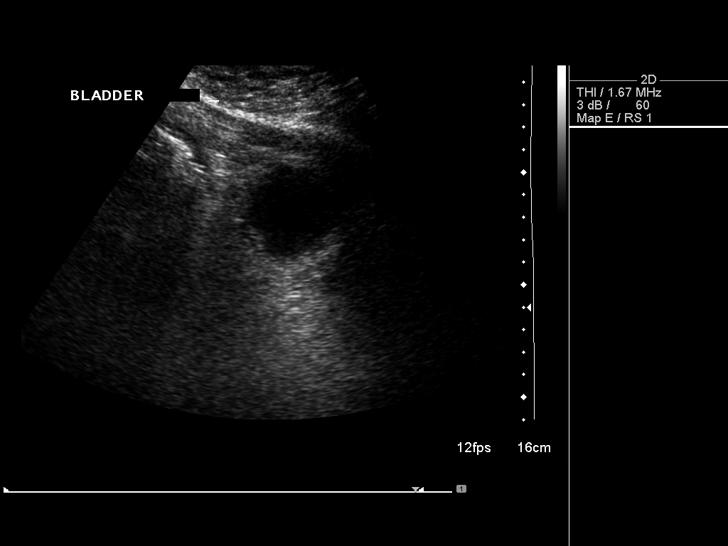
[im 33/98]
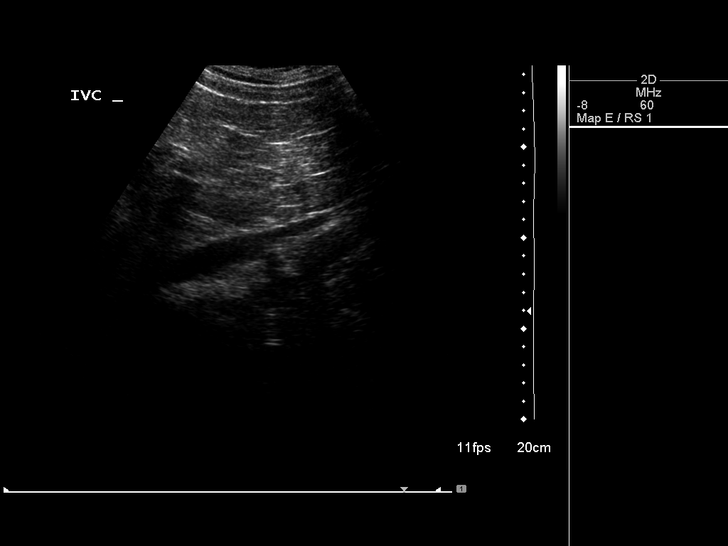
[im 41/98]
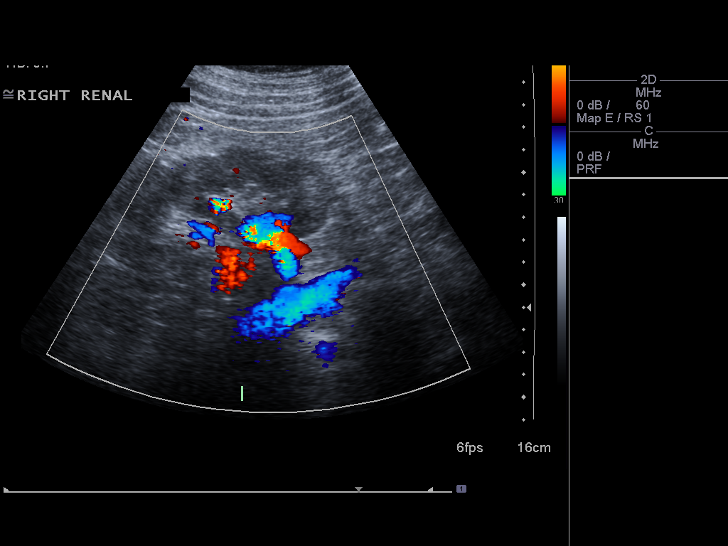
[im 49/98]
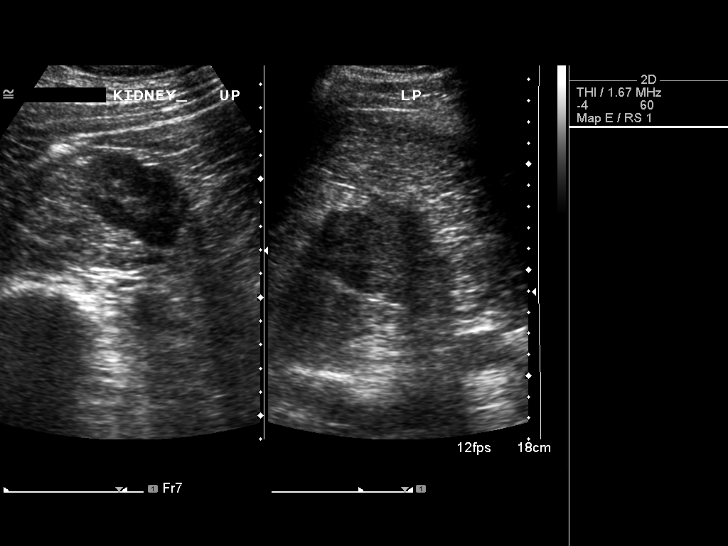
[im 57/98]
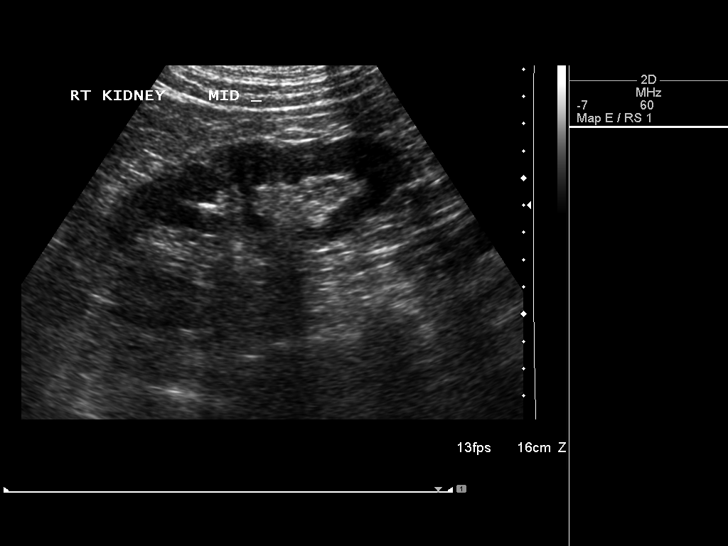
[im 65/98]
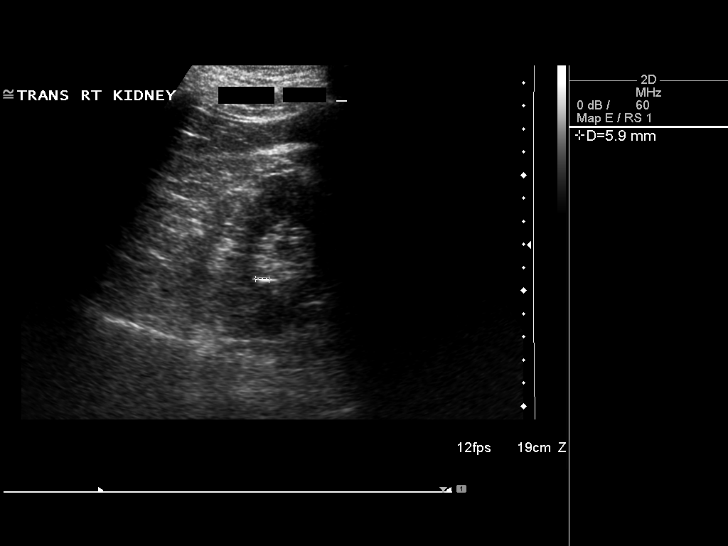
[im 73/98]
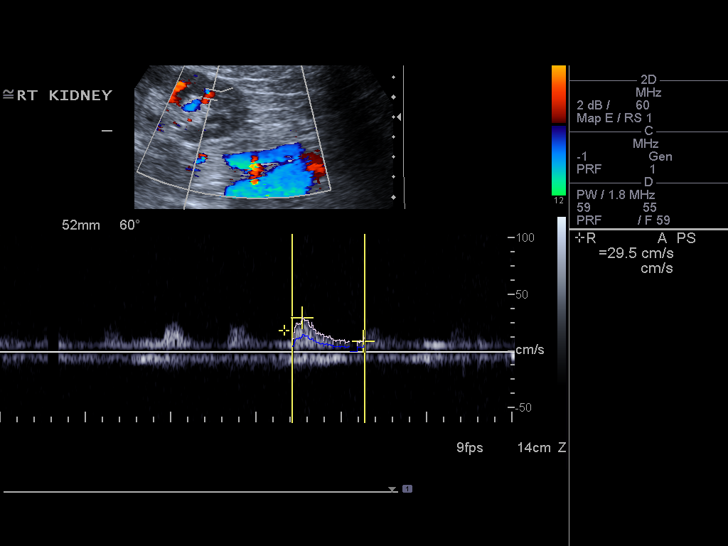
[im 81/98]
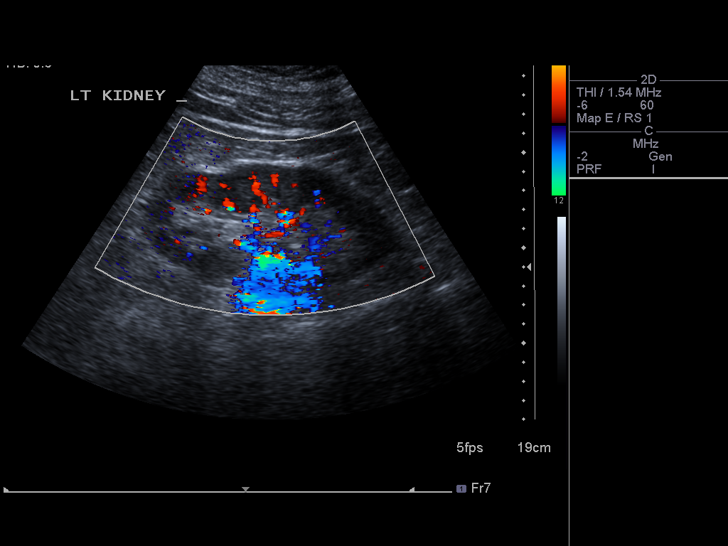
[im 89/98]
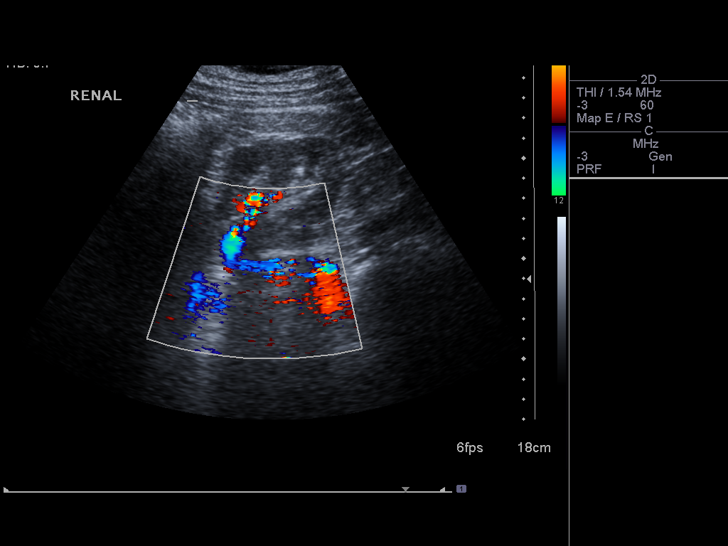
[im 98/98]
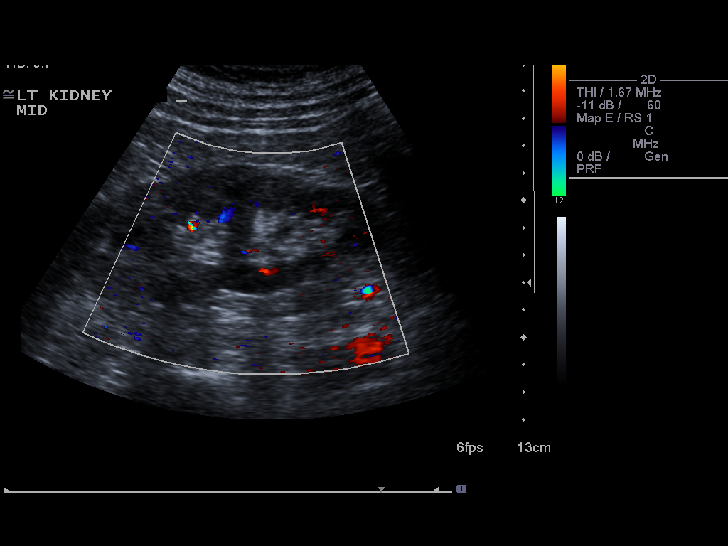

[13 of 25 positions shown; findings below may reference images not displayed]

FINDINGS: Right Kidney:

Normal cortical thickness, echogenicity and size, measuring 12.9 cm
in length. Note is made of mild fetal lobulation of the right
kidney. No focal renal lesions. There are multiple (at least 5)
punctate sub cm partially shadowing nonobstructing echogenic stones
within the right kidney, largest of which within the mid aspect of
the right kidney measures approximately 9 mm in diameter (image 57).
No urinary obstruction.

Left Kidney:

Normal cortical thickness, echogenicity and size, measuring 14.2 cm
in length. No focal renal lesions. There is a punctate
(approximately 0.5 cm) nonobstructing echogenic stone within the mid
aspect of the left kidney (image 97). . No urinary obstruction.

Bladder: Normal given degree distention. Bilateral ureteral jets are
identified.

RENAL DUPLEX ULTRASOUND

Right Renal Artery Velocities:

Origin:  138 cm/sec

Mid:  136 cm/sec

Hilum:  96 cm/sec

Interlobar:  46 cm/sec

Arcuate:  30 Cm/sec

Left Renal Artery Velocities:

Origin:  81 cm/sec

Mid:  119 cm/sec

Hilum:  79 cm/sec

Interlobar:  37 cm/sec

Arcuate:  36 cm/sec

Aortic Velocity:  112 Cm/sec

Right Renal-Aortic Ratios:

Origin:

Mid:

Hilum:

Interlobar:

Arcuate:

Left Renal-Aortic Ratios:

Origin:

Mid:

Hilum:

Interlobar:

Arcuate:

Normal caliber of the abdominal aorta.

Normal waveforms and velocities are identified within the bilateral
renal arteries and interrogated portions of the bilateral renal
parenchyma.

The bilateral renal veins are widely patent.
IMPRESSION: 1. No evidence of renal artery stenosis.
2. Multiple bilateral nonobstructing renal stones, right greater
than left, possibly progressed when compared to prior abdominal
ultrasound and remote abdominal CT.

## 2014-08-23 ENCOUNTER — Other Ambulatory Visit: Payer: Self-pay | Admitting: Internal Medicine

## 2014-09-17 LAB — HEPATIC FUNCTION PANEL
ALT: 42 U/L — AB (ref 10–40)
AST: 22 U/L (ref 14–40)
Alkaline Phosphatase: 55 U/L (ref 25–125)

## 2014-09-17 LAB — BASIC METABOLIC PANEL
BUN: 17 mg/dL (ref 4–21)
Creatinine: 1.1 mg/dL (ref 0.6–1.3)
Glucose: 165 mg/dL
Potassium: 4.4 mmol/L (ref 3.4–5.3)
Sodium: 139 mmol/L (ref 137–147)

## 2014-09-17 LAB — HM DIABETES FOOT EXAM: HM DIABETIC FOOT EXAM: NORMAL

## 2014-09-17 LAB — CBC AND DIFFERENTIAL
HEMATOCRIT: 49 % (ref 41–53)
Hemoglobin: 16.1 g/dL (ref 13.5–17.5)
PLATELETS: 220 10*3/uL (ref 150–399)
WBC: 5.5 10^3/mL

## 2014-09-17 LAB — LIPID PANEL
Cholesterol: 163 mg/dL (ref 0–200)
HDL: 40 mg/dL (ref 35–70)
LDL Cholesterol: 93 mg/dL
LDl/HDL Ratio: 2.3
TRIGLYCERIDES: 151 mg/dL (ref 40–160)

## 2014-09-17 LAB — POCT ERYTHROCYTE SEDIMENTATION RATE, NON-AUTOMATED: Sed Rate: 0 mm

## 2014-09-17 LAB — HEMOGLOBIN A1C: Hgb A1c MFr Bld: 7 % — AB (ref 4.0–6.0)

## 2014-09-17 LAB — TSH: TSH: 1.19 u[IU]/mL (ref 0.41–5.90)

## 2014-12-20 ENCOUNTER — Other Ambulatory Visit: Payer: Self-pay | Admitting: Internal Medicine

## 2015-01-08 ENCOUNTER — Ambulatory Visit: Payer: BLUE CROSS/BLUE SHIELD | Admitting: Internal Medicine

## 2015-06-19 ENCOUNTER — Encounter: Payer: Self-pay | Admitting: Internal Medicine

## 2015-07-18 ENCOUNTER — Encounter: Payer: Self-pay | Admitting: Internal Medicine

## 2015-07-31 LAB — HM DIABETES EYE EXAM

## 2015-08-05 ENCOUNTER — Encounter: Payer: Self-pay | Admitting: Internal Medicine

## 2015-08-14 ENCOUNTER — Ambulatory Visit (AMBULATORY_SURGERY_CENTER): Payer: Self-pay

## 2015-08-14 VITALS — Ht 74.0 in | Wt 245.0 lb

## 2015-08-14 DIAGNOSIS — Z860100 Personal history of colon polyps, unspecified: Secondary | ICD-10-CM

## 2015-08-14 NOTE — Progress Notes (Signed)
No allergies to eggs or soy No diet meds No home oxygen No past problems with anesthesia  Has email and internet; refused emmi

## 2015-08-18 ENCOUNTER — Encounter: Payer: Self-pay | Admitting: Internal Medicine

## 2015-08-20 ENCOUNTER — Telehealth: Payer: Self-pay | Admitting: Internal Medicine

## 2015-08-20 NOTE — Telephone Encounter (Signed)
Medication denied, requested pharmacy to have Pt call office. Glimepiride no longer on med list?

## 2015-09-01 ENCOUNTER — Ambulatory Visit (AMBULATORY_SURGERY_CENTER): Payer: BLUE CROSS/BLUE SHIELD | Admitting: Internal Medicine

## 2015-09-01 ENCOUNTER — Encounter: Payer: Self-pay | Admitting: Internal Medicine

## 2015-09-01 VITALS — BP 102/77 | HR 68 | Temp 98.6°F | Resp 12 | Ht 74.0 in | Wt 245.0 lb

## 2015-09-01 DIAGNOSIS — Z860100 Personal history of colon polyps, unspecified: Secondary | ICD-10-CM

## 2015-09-01 DIAGNOSIS — D123 Benign neoplasm of transverse colon: Secondary | ICD-10-CM

## 2015-09-01 LAB — GLUCOSE, CAPILLARY
Glucose-Capillary: 168 mg/dL — ABNORMAL HIGH (ref 65–99)
Glucose-Capillary: 138 mg/dL — ABNORMAL HIGH (ref 65–99)

## 2015-09-01 MED ORDER — SODIUM CHLORIDE 0.9 % IV SOLN: 500.0000 mL | INTRAVENOUS | Status: DC

## 2015-09-01 NOTE — Progress Notes (Signed)
Called to room to assist during endoscopic procedure.  Patient ID and intended procedure confirmed with present staff. Received instructions for my participation in the procedure from the performing physician.  

## 2015-09-01 NOTE — Progress Notes (Signed)
Report given to PACU RN, vss 

## 2015-09-01 NOTE — Op Note (Signed)
[name] Procedure Date: 09/01/2015 10:13 AM MRN: [id number] Endoscopist: Jerene Bears , MD Age: 65 Referring MD:  Date of Birth: 1950/07/17 Gender: Male Procedure:                Colonoscopy Indications:              Surveillance: Personal history of adenomatous                            polyps on last colonoscopy 3 years ago Medicines:                Monitored Anesthesia Care Procedure:                Pre-Anesthesia Assessment:                           - Prior to the procedure, a History and Physical                            was performed, and patient medications and                            allergies were reviewed. The patient's tolerance of                            previous anesthesia was also reviewed. The risks                            and benefits of the procedure and the sedation                            options and risks were discussed with the patient.                            All questions were answered, and informed consent                            was obtained. Prior Anticoagulants: The patient has                            taken no previous anticoagulant or antiplatelet                            agents. ASA Grade Assessment: II - A patient with                            mild systemic disease. After reviewing the risks                            and benefits, the patient was deemed in                            satisfactory condition to undergo the procedure.  After obtaining informed consent, the colonoscope                            was passed under direct vision. Throughout the                            procedure, the patient's blood pressure, pulse, and                            oxygen saturations were monitored continuously. The                            Model CF-HQ190L [id number]) scope was introduced                            through the anus and advanced to the the cecum,                          identified by appendiceal orifice and ileocecal                            valve. The colonoscopy was performed without                            difficulty. The patient tolerated the procedure                            well. The quality of the bowel preparation was                            good. The ileocecal valve, appendiceal orifice, and                            rectum were photographed. Scope In: 10:23:12 AM Scope Out: 10:36:49 AM Scope Withdrawal Time: 0 hours 11 minutes 41 seconds  Total Procedure Duration: 0 hours 13 minutes 37 seconds  Findings:                 Two sessile polyps were found in the transverse                            colon. The polyps were 4 to 7 mm in size. These                            polyps were removed with a cold snare. Resection                            and retrieval were complete.                           Multiple diverticula were found in the left colon.                           Internal hemorrhoids were found during  retroflexion. The hemorrhoids were small. Complications:            No immediate complications. Estimated Blood Loss:     Estimated blood loss: none. Impression:               - Two 4 to 7 mm polyps in the transverse colon,                            removed with a cold snare. Resected and retrieved.                           - Moderate diverticulosis in the left colon.                           - Internal hemorrhoids. Recommendation:           - Patient has a contact number available for                            emergencies. The signs and symptoms of potential                            delayed complications were discussed with the                            patient. Return to normal activities tomorrow.                            Written discharge instructions were provided to the                            patient.                           - Resume previous diet.                            - Continue present medications.                           - Await pathology results.                           - Repeat colonoscopy in 5 years for surveillance. Jerene Bears, MD 09/01/2015 10:39:45 AM This report has been signed electronically.

## 2015-09-01 NOTE — Patient Instructions (Signed)
YOU HAD AN ENDOSCOPIC PROCEDURE TODAY AT THE [facility]:   Refer to the procedure report that was given to you for any specific questions about what was found during the examination.  If the procedure report does not answer your questions, please call your gastroenterologist to clarify.  If you requested that your care partner not be given the details of your procedure findings, then the procedure report has been included in a sealed envelope for you to review at your convenience later.  YOU SHOULD EXPECT: Some feelings of bloating in the abdomen. Passage of more gas than usual.  Walking can help get rid of the air that was put into your GI tract during the procedure and reduce the bloating. If you had a lower endoscopy (such as a colonoscopy or flexible sigmoidoscopy) you may notice spotting of blood in your stool or on the toilet paper. If you underwent a bowel prep for your procedure, you may not have a normal bowel movement for a few days.  Please Note:  You might notice some irritation and congestion in your nose or some drainage.  This is from the oxygen used during your procedure.  There is no need for concern and it should clear up in a day or so.  SYMPTOMS TO REPORT IMMEDIATELY:   Following lower endoscopy (colonoscopy or flexible sigmoidoscopy):  Excessive amounts of blood in the stool  Significant tenderness or worsening of abdominal pains  Swelling of the abdomen that is new, acute  Fever of 100F or higher   For urgent or emergent issues, a gastroenterologist can be reached at any hour by calling [phone].   DIET: Your first meal following the procedure should be a small meal and then it is ok to progress to your normal diet. Heavy or fried foods are harder to digest and may make you feel nauseous or bloated.  Likewise, meals heavy in dairy and vegetables can increase bloating.  Drink plenty of fluids but you should avoid alcoholic beverages for 24 hours. Try to  increase the fiber in your diet, and drink plenty of water.  ACTIVITY:  You should plan to take it easy for the rest of today and you should NOT DRIVE or use heavy machinery until tomorrow (because of the sedation medicines used during the test).    FOLLOW UP: Our staff will call the number listed on your records the next business day following your procedure to check on you and address any questions or concerns that you may have regarding the information given to you following your procedure. If we do not reach you, we will leave a message.  However, if you are feeling well and you are not experiencing any problems, there is no need to return our call.  We will assume that you have returned to your regular daily activities without incident.  If any biopsies were taken you will be contacted by phone or by letter within the next 1-3 weeks.  Please call us at [phone] if you have not heard about the biopsies in 3 weeks.    SIGNATURES/CONFIDENTIALITY: You and/or your care partner have signed paperwork which will be entered into your electronic medical record.  These signatures attest to the fact that that the information above on your After Visit Summary has been reviewed and is understood.  Full responsibility of the confidentiality of this discharge information lies with you and/or your care-partner.  Read all of the handouts given to you by your recovery   room nurse.  Thank-you for choosing us for your healthcare needs today. 

## 2015-09-02 ENCOUNTER — Telehealth: Payer: Self-pay | Admitting: *Deleted

## 2015-09-02 NOTE — Telephone Encounter (Signed)
  Follow up Call-  Call back number 09/01/2015  Post procedure Call Back phone  # [phone]  Permission to leave phone message Yes     Patient questions:  Do you have a fever, pain , or abdominal swelling? No. Pain Score  0 *  Have you tolerated food without any problems? Yes.    Have you been able to return to your normal activities? Yes.    Do you have any questions about your discharge instructions: Diet   No. Medications  No. Follow up visit  No.  Do you have questions or concerns about your Care? No.  Actions: * If pain score is 4 or above: No action needed, pain <4.

## 2015-09-05 ENCOUNTER — Encounter: Payer: Self-pay | Admitting: Internal Medicine

## 2015-10-15 ENCOUNTER — Encounter: Payer: BLUE CROSS/BLUE SHIELD | Admitting: Internal Medicine

## 2015-11-19 ENCOUNTER — Encounter: Payer: Self-pay | Admitting: Internal Medicine

## 2015-11-19 ENCOUNTER — Ambulatory Visit (INDEPENDENT_AMBULATORY_CARE_PROVIDER_SITE_OTHER): Payer: Medicare Other | Admitting: Internal Medicine

## 2015-11-19 VITALS — BP 118/70 | HR 93 | Temp 98.2°F | Resp 12 | Ht 74.0 in | Wt 234.4 lb

## 2015-11-19 DIAGNOSIS — I10 Essential (primary) hypertension: Secondary | ICD-10-CM | POA: Diagnosis not present

## 2015-11-19 DIAGNOSIS — E1140 Type 2 diabetes mellitus with diabetic neuropathy, unspecified: Secondary | ICD-10-CM | POA: Diagnosis not present

## 2015-11-19 DIAGNOSIS — E291 Testicular hypofunction: Secondary | ICD-10-CM | POA: Diagnosis not present

## 2015-11-19 DIAGNOSIS — E785 Hyperlipidemia, unspecified: Secondary | ICD-10-CM

## 2015-11-19 DIAGNOSIS — E7800 Pure hypercholesterolemia, unspecified: Secondary | ICD-10-CM | POA: Diagnosis not present

## 2015-11-19 DIAGNOSIS — E1165 Type 2 diabetes mellitus with hyperglycemia: Secondary | ICD-10-CM | POA: Diagnosis not present

## 2015-11-19 DIAGNOSIS — Z0000 Encounter for general adult medical examination without abnormal findings: Secondary | ICD-10-CM

## 2015-11-19 DIAGNOSIS — Z09 Encounter for follow-up examination after completed treatment for conditions other than malignant neoplasm: Secondary | ICD-10-CM | POA: Insufficient documentation

## 2015-11-19 LAB — COMPREHENSIVE METABOLIC PANEL
ALBUMIN: 4.7 g/dL (ref 3.5–5.2)
ALT: 35 U/L (ref 0–53)
AST: 24 U/L (ref 0–37)
Alkaline Phosphatase: 48 U/L (ref 39–117)
BUN: 17 mg/dL (ref 6–23)
CHLORIDE: 104 meq/L (ref 96–112)
CO2: 25 meq/L (ref 19–32)
CREATININE: 0.99 mg/dL (ref 0.40–1.50)
Calcium: 9.7 mg/dL (ref 8.4–10.5)
GFR: 80.61 mL/min (ref >60.00)
Glucose, Bld: 119 mg/dL — ABNORMAL HIGH (ref 70–99)
Potassium: 4.1 mEq/L (ref 3.5–5.1)
SODIUM: 138 meq/L (ref 135–145)
Total Bilirubin: 0.7 mg/dL (ref 0.2–1.2)
Total Protein: 8.1 g/dL (ref 6.0–8.3)

## 2015-11-19 LAB — CBC WITH DIFFERENTIAL/PLATELET
Basophils Absolute: 0 10*3/uL (ref 0.0–0.1)
Basophils Relative: 0.6 % (ref 0.0–3.0)
Eosinophils Absolute: 0.1 10*3/uL (ref 0.0–0.7)
Eosinophils Relative: 1.2 % (ref 0.0–5.0)
HCT: 49 % (ref 39.0–52.0)
Hemoglobin: 16.8 g/dL (ref 13.0–17.0)
Lymphocytes Relative: 31.6 % (ref 12.0–46.0)
Lymphs Abs: 2.5 10*3/uL (ref 0.7–4.0)
MCHC: 34.2 g/dL (ref 30.0–36.0)
MCV: 93 fl (ref 78.0–100.0)
Monocytes Absolute: 0.5 10*3/uL (ref 0.1–1.0)
Monocytes Relative: 6.9 % (ref 3.0–12.0)
Neutro Abs: 4.7 10*3/uL (ref 1.4–7.7)
Neutrophils Relative %: 59.7 % (ref 43.0–77.0)
Platelets: 264 10*3/uL (ref 150.0–400.0)
RBC: 5.27 Mil/uL (ref 4.22–5.81)
RDW: 13.7 % (ref 11.5–15.5)
WBC: 7.9 10*3/uL (ref 4.0–10.5)

## 2015-11-19 LAB — LIPID PANEL
Cholesterol: 190 mg/dL (ref 0–200)
HDL: 47.6 mg/dL (ref >39.00)
LDL Cholesterol: 110 mg/dL — ABNORMAL HIGH (ref 0–99)
NonHDL: 142.74
Total CHOL/HDL Ratio: 4
Triglycerides: 166 mg/dL — ABNORMAL HIGH (ref 0.0–149.0)
VLDL: 33.2 mg/dL (ref 0.0–40.0)

## 2015-11-19 NOTE — Patient Instructions (Signed)
GO TO THE LAB : Get the blood work     GO TO THE FRONT DESK Schedule your next appointment for a physical exam in one year  Start aspirin 81 mg

## 2015-11-19 NOTE — Progress Notes (Signed)
Subjective:    Patient ID: Samuel Meyer, male    DOB: 08-31-1950, 65 y.o.   MRN: [id number]  DOS:  11/19/2015 Type of visit - description : cpx Interval history: Here for Medicare AWV:  1. Risk factors based on Past M, S, F history: reviewed 2. Physical Activities: more active lately, loosing weight.  3. Depression/mood: neg screening  4. Hearing:  No problems noted or reported  5. ADL's: independent, drives  6. Fall Risk: no recent falls , low risk 7. home Safety: does feel safe at home  8. Height, weight, & visual acuity: see VS, recommend to see eye doctor regulalrly 9. Counseling: provided 10. Labs ordered based on risk factors: if needed  11. Referral Coordination: if needed 12. Care Plan, see assessment and plan, personalized plan discussed 13. Cognitive Assessment: motor skills and cognition appropriate for age 22. Care team updated      In addition, today we discussed the following: DM: just saw endocrinology and  had a A1c of 6.7. HTN: Good med compliance, BP today is very good Review of Systems  Constitutional: No fever. No chills. No unexplained wt changes. No unusual sweats  HEENT: No dental problems, no ear discharge, no facial swelling, no voice changes. No eye discharge, no eye  redness , no  intolerance to light   Respiratory: No wheezing , no  difficulty breathing. No cough , no mucus production  Cardiovascular: No CP, no leg swelling , no  Palpitations  GI: no nausea, no vomiting, no diarrhea , no  abdominal pain.  No blood in the stools. No dysphagia, no odynophagia    Endocrine: No polyphagia, no polyuria , no polydipsia  GU: No dysuria, gross hematuria, difficulty urinating. No urinary urgency, no frequency.  Musculoskeletal: No joint swellings or unusual aches or pains  Skin: No change in the color of the skin, palor , no  Rash  Allergic, immunologic: No environmental allergies , no  food allergies  Neurological: No dizziness no  syncope. No  headaches. No diplopia, no slurred, no slurred speech, no motor deficits, no facial  Numbness  Hematological: No enlarged lymph nodes, no easy bruising , no unusual bleedings  Psychiatry: No suicidal ideas, no hallucinations, no beavior problems, no confusion.  No unusual/severe anxiety, no depression  Past Medical History:  Diagnosis Date  . Diabetes mellitus 2/09  . Hyperlipidemia   . Hypertension   . Kidney stone   . Psoriatic arthritis ([place])    [facility] started on methotrexate 03/12/09, switch to enbrel aprox 2/11, seeing Dr. Amil Amen  . S/P cardiac cath   . Skin cancer    remotely, 4th L finger, not melanoma  . Sleep apnea    no treatment, pt denies diagnosis  . Testosterone deficiency     Past Surgical History:  Procedure Laterality Date  . APPENDECTOMY  1993  . [place]   left  . SHOULDER SURGERY  2008  . TONSILLECTOMY  1960    Social History   Social History  . Marital status: Married    Spouse name: N/A  . Number of children: 1  . Years of education: N/A   Occupational History  . part time-works on  business development    Social History Main Topics  . Smoking status: Former Smoker    Quit date: 04/12/1978  . Smokeless tobacco: Never Used  . Alcohol use No  . Drug use: No  . Sexual activity: Not on file   Other Topics  Concern  . Not on file   Social History Narrative   Household--pt and wife     Family History  Problem Relation Age of Onset  . Stroke Father     cerebral hemorrhage, stroke x 2  . Heart attack Father     first CABG at age 60  . Diabetes Father   . Hypertension Mother   . Glaucoma Other     siblings  . Asthma Other     GM  . Lung cancer Other     GM  . Prostate cancer Neg Hx   . Colon cancer Neg Hx        Medication List       Accurate as of 11/19/15  5:43 PM. Always use your most recent med list.          aspirin 81 MG tablet Take 81 mg by mouth daily.   ENBREL 50 MG/ML injection Generic drug:   etanercept Inject 50 mg into the skin once a week.   glimepiride 2 MG tablet Commonly known as:  AMARYL Take 2 mg by mouth 4 (four) times daily.   Glucose Blood Disk Commonly known as:  BAYER BREEZE 2 TEST Check blood sugar no more than twice daily.   INVOKANA 100 MG Tabs tablet Generic drug:  canagliflozin Take 100 mg by mouth daily.   losartan 100 MG tablet Commonly known as:  COZAAR Take 1 tablet (100 mg total) by mouth daily.   metFORMIN 1000 MG tablet Commonly known as:  GLUCOPHAGE TAKE 1.5 TABLET BY MOUTH TWICE A DAY   VITAMIN D2 PO Take 1 tablet by mouth daily.          Objective:   Physical Exam BP 118/70 (BP Location: Left Arm, Patient Position: Sitting, Cuff Size: Normal)   Pulse 93   Temp 98.2 F (36.8 C) (Oral)   Resp 12   Ht 6\' 2"  (1.88 m)   Wt 234 lb 6 oz (106.3 kg)   SpO2 93%   BMI 30.09 kg/m   General:   Well developed, well nourished . NAD.  Neck: No  thyromegaly  HEENT:  Normocephalic . Face symmetric, atraumatic Lungs:  CTA B Normal respiratory effort, no intercostal retractions, no accessory muscle use. Heart: RRR,  no murmur.  No pretibial edema bilaterally  Abdomen:  Not distended, soft, non-tender. No rebound or rigidity.   Skin: Exposed areas without rash. Not pale. Not jaundice Neurologic:  alert & oriented X3.  Speech normal, gait appropriate for age and unassisted Strength symmetric and appropriate for age.  Psych: Cognition and judgment appear intact.  Cooperative with normal attention span and concentration.  Behavior appropriate. No anxious or depressed appearing.    Assessment & Plan:   Assessment DM, per endocrinology, Dr Chalmers Cater  HTN Hyperlipidemia-- statins intolerant Psoriatic arthritis-- Dr Amil Amen Testosterone deficiency - pt reports is f/u by endo (as of 11-2015) Increase LFTs:  LFTs were okay until 2011 about the time was rx Enbrel. Hep B and C serology (-) 2012. [place] 2014: fatty liver  Iron, ferritin normal.  Ceruloplasmin slt low 2014 ---> GI rx 24 hour urine copper test OSA-- no CPAP, no sx as of 11-2015 Polycythemia, mild H/o mild  b 12 def  Skin cancer, remotely, not melanoma  PLAN: DM per endocrinology HTN under excellent control, continue meds, check labs Hyperlipidemia: Statin intolerant, check labs Elevated LFTs: Previous workup negative, at some point was recommended a 24-hour urine Cooper tests, not done. Check LFTs, at  some point consider check the urine test as recommended by GI in the past RTC one year

## 2015-11-19 NOTE — Progress Notes (Signed)
Pre visit review using our clinic review tool, if applicable. No additional management support is needed unless otherwise documented below in the visit note. 

## 2015-11-19 NOTE — Assessment & Plan Note (Addendum)
TD 8- 2010; Pneumonia shot 2014;  prevnar 2016; zostavax -- done 2014   [facility]--08/2012--Dr Pyrtle--adenomatous polyps; cscope again 2017: next 5 years per letter    Prostate cancer screening, PSA 2016 < 1 and stable.  Doing well with lifestyle Add aspirin 81 mg Labs: CMP, FLP, CBC

## 2015-11-19 NOTE — Telephone Encounter (Signed)
[facility] report from 07/31/2015 faxed to Dr. Chalmers Cater at [phone]. Pt informed via MyChart.

## 2015-11-19 NOTE — Assessment & Plan Note (Signed)
DM per endocrinology HTN under excellent control, continue meds, check labs Hyperlipidemia: Statin intolerant, check labs Elevated LFTs: Previous workup negative, at some point was recommended a 24-hour urine Cooper tests, not done. Check LFTs, at some point consider check the urine test as recommended by GI in the past RTC one year

## 2015-12-03 ENCOUNTER — Encounter: Payer: Self-pay | Admitting: Internal Medicine

## 2015-12-03 MED ORDER — LOSARTAN POTASSIUM 100 MG PO TABS: 100.0000 mg | ORAL_TABLET | Freq: Every day | ORAL | 0 refills | Status: DC

## 2015-12-03 MED ORDER — LOSARTAN POTASSIUM 100 MG PO TABS: 100.0000 mg | ORAL_TABLET | Freq: Every day | ORAL | 2 refills | Status: DC

## 2015-12-03 NOTE — Addendum Note (Signed)
Addended byDamita Dunnings D on: 12/03/2015 09:09 AM   Modules accepted: Orders

## 2015-12-03 NOTE — Telephone Encounter (Signed)
Losartan 100mg  sent to [employer] order and 15 day supply sent to [facility] in [place], [place] as requested.

## 2015-12-24 DIAGNOSIS — L409 Psoriasis, unspecified: Secondary | ICD-10-CM | POA: Diagnosis not present

## 2015-12-24 DIAGNOSIS — L4059 Other psoriatic arthropathy: Secondary | ICD-10-CM | POA: Diagnosis not present

## 2016-04-07 ENCOUNTER — Encounter: Payer: Self-pay | Admitting: Internal Medicine

## 2016-04-08 NOTE — Telephone Encounter (Signed)
Left patient a message to call office back if more questions. Adjustments have been made and claim resubmitted to insurance for payment.

## 2016-06-02 DIAGNOSIS — Z13820 Encounter for screening for osteoporosis: Secondary | ICD-10-CM | POA: Diagnosis not present

## 2016-06-02 DIAGNOSIS — E291 Testicular hypofunction: Secondary | ICD-10-CM | POA: Diagnosis not present

## 2016-06-02 DIAGNOSIS — E1165 Type 2 diabetes mellitus with hyperglycemia: Secondary | ICD-10-CM | POA: Diagnosis not present

## 2016-06-02 DIAGNOSIS — Z125 Encounter for screening for malignant neoplasm of prostate: Secondary | ICD-10-CM | POA: Diagnosis not present

## 2016-06-02 DIAGNOSIS — E7800 Pure hypercholesterolemia, unspecified: Secondary | ICD-10-CM | POA: Diagnosis not present

## 2016-06-09 DIAGNOSIS — E7800 Pure hypercholesterolemia, unspecified: Secondary | ICD-10-CM | POA: Diagnosis not present

## 2016-06-09 DIAGNOSIS — E1165 Type 2 diabetes mellitus with hyperglycemia: Secondary | ICD-10-CM | POA: Diagnosis not present

## 2016-06-09 DIAGNOSIS — Z23 Encounter for immunization: Secondary | ICD-10-CM | POA: Diagnosis not present

## 2016-06-09 DIAGNOSIS — I10 Essential (primary) hypertension: Secondary | ICD-10-CM | POA: Diagnosis not present

## 2016-06-09 DIAGNOSIS — E291 Testicular hypofunction: Secondary | ICD-10-CM | POA: Diagnosis not present

## 2016-06-23 DIAGNOSIS — E669 Obesity, unspecified: Secondary | ICD-10-CM | POA: Diagnosis not present

## 2016-06-23 DIAGNOSIS — Z6831 Body mass index (BMI) 31.0-31.9, adult: Secondary | ICD-10-CM | POA: Diagnosis not present

## 2016-06-23 DIAGNOSIS — L409 Psoriasis, unspecified: Secondary | ICD-10-CM | POA: Diagnosis not present

## 2016-06-23 DIAGNOSIS — L4059 Other psoriatic arthropathy: Secondary | ICD-10-CM | POA: Diagnosis not present

## 2016-10-06 DIAGNOSIS — E1165 Type 2 diabetes mellitus with hyperglycemia: Secondary | ICD-10-CM | POA: Diagnosis not present

## 2016-11-24 ENCOUNTER — Encounter: Payer: No Typology Code available for payment source | Admitting: Internal Medicine

## 2016-12-22 ENCOUNTER — Ambulatory Visit (INDEPENDENT_AMBULATORY_CARE_PROVIDER_SITE_OTHER): Payer: Medicare Other | Admitting: Internal Medicine

## 2016-12-22 ENCOUNTER — Encounter: Payer: Self-pay | Admitting: Internal Medicine

## 2016-12-22 VITALS — BP 128/80 | HR 102 | Temp 98.2°F | Resp 14 | Ht 73.0 in | Wt 242.5 lb

## 2016-12-22 DIAGNOSIS — L4050 Arthropathic psoriasis, unspecified: Secondary | ICD-10-CM | POA: Diagnosis not present

## 2016-12-22 DIAGNOSIS — L4059 Other psoriatic arthropathy: Secondary | ICD-10-CM | POA: Diagnosis not present

## 2016-12-22 DIAGNOSIS — Z125 Encounter for screening for malignant neoplasm of prostate: Secondary | ICD-10-CM

## 2016-12-22 DIAGNOSIS — Z6831 Body mass index (BMI) 31.0-31.9, adult: Secondary | ICD-10-CM | POA: Diagnosis not present

## 2016-12-22 DIAGNOSIS — E118 Type 2 diabetes mellitus with unspecified complications: Secondary | ICD-10-CM

## 2016-12-22 DIAGNOSIS — E785 Hyperlipidemia, unspecified: Secondary | ICD-10-CM

## 2016-12-22 DIAGNOSIS — E669 Obesity, unspecified: Secondary | ICD-10-CM | POA: Diagnosis not present

## 2016-12-22 DIAGNOSIS — L409 Psoriasis, unspecified: Secondary | ICD-10-CM | POA: Diagnosis not present

## 2016-12-22 DIAGNOSIS — Z0000 Encounter for general adult medical examination without abnormal findings: Secondary | ICD-10-CM

## 2016-12-22 DIAGNOSIS — Z23 Encounter for immunization: Secondary | ICD-10-CM

## 2016-12-22 DIAGNOSIS — D518 Other vitamin B12 deficiency anemias: Secondary | ICD-10-CM | POA: Diagnosis not present

## 2016-12-22 DIAGNOSIS — I10 Essential (primary) hypertension: Secondary | ICD-10-CM | POA: Diagnosis not present

## 2016-12-22 NOTE — Progress Notes (Signed)
Subjective:    Patient ID: Samuel Meyer, male    DOB: Sep 30, 1950, 66 y.o.   MRN: [id number]  DOS:  12/22/2016 Type of visit - description : rov Interval history: No major concerns DM. is follow-up by endocrinology, would like labs to be forwarded to Dr. Chalmers Cater Psoriatic arthritis: Symptoms well-controlled HTN-  Good compliance with medication, ambulatory BPs when checked are normal. Has developed hand tremors, mostly with action, so far has not interfered with he activities of daily living. + FH of tremors, his mother   Review of Systems Denies chest pain or difficulty breathing No nausea, vomiting, diarrhea No anxiety depression He remains very active  Past Medical History:  Diagnosis Date  . Diabetes mellitus 2/09  . Hyperlipidemia   . Hypertension   . Kidney stone   . Psoriatic arthritis ([place])    [facility] started on methotrexate 03/12/09, switch to enbrel aprox 2/11, seeing Dr. Amil Amen  . S/P cardiac cath   . Skin cancer    remotely, 4th L finger, not melanoma  . Sleep apnea    no treatment, pt denies diagnosis  . Testosterone deficiency     Past Surgical History:  Procedure Laterality Date  . APPENDECTOMY  1993  . [place]   left  . SHOULDER SURGERY  2008  . TONSILLECTOMY  1960    Social History   Social History  . Marital status: Married    Spouse name: N/A  . Number of children: 1  . Years of education: N/A   Occupational History  . part time-works on  business development    Social History Main Topics  . Smoking status: Former Smoker    Quit date: 04/12/1978  . Smokeless tobacco: Never Used  . Alcohol use No  . Drug use: No  . Sexual activity: Not on file   Other Topics Concern  . Not on file   Social History Narrative   Household--pt and wife      Allergies as of 12/22/2016      Reactions   Januvia [sitagliptin Phosphate] Rash   Rash   Statins       Medication List       Accurate as of 12/22/16 11:59 PM. Always use your most  recent med list.          aspirin 81 MG tablet Take 81 mg by mouth daily.   ENBREL 50 MG/ML injection Generic drug:  etanercept Inject 50 mg into the skin once a week.   glimepiride 2 MG tablet Commonly known as:  AMARYL Take 2 mg by mouth 4 (four) times daily.   Glucose Blood Disk Commonly known as:  BAYER BREEZE 2 TEST Check blood sugar no more than twice daily.   INVOKANA 100 MG Tabs tablet Generic drug:  canagliflozin Take 100 mg by mouth daily.   losartan 100 MG tablet Commonly known as:  COZAAR Take 1 tablet (100 mg total) by mouth daily.   metFORMIN 1000 MG tablet Commonly known as:  GLUCOPHAGE Take 1 tablet (1,000 mg total) by mouth 2 (two) times daily with a meal.   VITAMIN D2 PO Take 1 tablet by mouth daily.            Discharge Care Instructions        Start     Ordered   12/22/16 0000  Flu vaccine HIGH DOSE PF (Fluzone High dose)     12/22/16 1405   12/22/16 0000  Comp Met (CMET)  12/22/16 1419   12/22/16 0000  Lipid panel     12/22/16 1419   12/22/16 0000  CBC w/Diff     12/22/16 1419   12/22/16 0000  Hemoglobin A1c     12/22/16 1419   12/22/16 0000  TSH     12/22/16 1419   12/22/16 0000  B12     12/22/16 1419   12/22/16 0000  Sed Rate (ESR)     12/22/16 1419   12/22/16 0000  PSA     12/22/16 1419         Objective:   Physical Exam BP 128/80 (BP Location: Left Arm, Patient Position: Sitting, Cuff Size: Normal)   Pulse (!) 102   Temp 98.2 F (36.8 C) (Oral)   Resp 14   Ht 6' 1"  (1.854 m)   Wt 242 lb 8 oz (110 kg)   SpO2 96%   BMI 31.99 kg/m   General:   Well developed, well nourished . NAD.  Neck: No  thyromegaly  HEENT:  Normocephalic . Face symmetric, atraumatic Lungs:  CTA B Normal respiratory effort, no intercostal retractions, no accessory muscle use. Heart: RRR,  no murmur.  No pretibial edema bilaterally  Abdomen:  Not distended, soft, non-tender. No rebound or rigidity.   Skin: Exposed areas without  rash. Not pale. Not jaundice Rectal:  External abnormalities: none. Normal sphincter tone. No rectal masses or tenderness.  No stools found Prostate: Prostate gland firm and smooth, no enlargement, nodularity, tenderness, mass, asymmetry or induration.  Neurologic:  alert & oriented X3.  Speech normal, gait appropriate for age and unassisted Strength symmetric and appropriate for age.  + Action tremor, only hands, worse on the right.   Psych: Cognition and judgment appear intact.  Cooperative with normal attention span and concentration.  Behavior appropriate. No anxious or depressed appearing.    Assessment & Plan:   Assessment DM, per endocrinology, Dr Chalmers Cater  HTN Hyperlipidemia-- statins intolerant Psoriatic arthritis-- Dr Amil Amen Testosterone deficiency - pt reports is f/u by endo (as of 11-2015) Increase LFTs:  LFTs were okay until 2011 about the time was rx Enbrel. Hep B and C serology (-) 2012. [place] 2014: fatty liver  Iron, ferritin normal. Ceruloplasmin slt low 2014 ---> GI rx 24 hour urine copper test OSA-- no CPAP, no sx as of 11-2015 Polycythemia, mild H/o mild  b 12 def  Skin cancer, remotely, not melanoma  PLAN: DM: sees endocrinology, will check labs and fax them to Dr. Chalmers Cater: A1c, FLP HTN: Seems well-controlled on losartan, checking labs: CMP, CBC, TSH Psoriatic arthritis: Per rheumatology, we'll forward labs to Dr. Amil Amen Vitamin B 12 deficiency per history, checking labs Essential tremor: Hand shakiness likely essential tremor, no Parkinsonian features, rec observation. If sx severe let me know for potential treatment/referral RTC one year

## 2016-12-22 NOTE — Progress Notes (Signed)
Pre visit review using our clinic review tool, if applicable. No additional management support is needed unless otherwise documented below in the visit note. 

## 2016-12-22 NOTE — Assessment & Plan Note (Addendum)
-  Td 8- 2010; Pneumonia shot 2014;  prevnar 2016; zostavax: done 2014; shingrex dicussed, he qualifies, shot is on back order; flu shot today -[facility]--08/2012--Dr Pyrtle--adenomatous polyps; cscope again 2017: next 5 years per letter   -Prostate cancer screening, DRE wnl today , check a PSA . -Doing well with lifestyle -Labs:  CMP, FLP, CBC, A1c, TSH, PSA, B12, sedimentation rate

## 2016-12-22 NOTE — Patient Instructions (Addendum)
   GO TO THE FRONT DESK Schedule your next appointment for a  routine checkup in 1 year   Schedule labs to be gone tomorrow, fasting

## 2016-12-23 ENCOUNTER — Other Ambulatory Visit (INDEPENDENT_AMBULATORY_CARE_PROVIDER_SITE_OTHER): Payer: Medicare Other

## 2016-12-23 DIAGNOSIS — E118 Type 2 diabetes mellitus with unspecified complications: Secondary | ICD-10-CM

## 2016-12-23 DIAGNOSIS — Z125 Encounter for screening for malignant neoplasm of prostate: Secondary | ICD-10-CM | POA: Diagnosis not present

## 2016-12-23 DIAGNOSIS — I10 Essential (primary) hypertension: Secondary | ICD-10-CM

## 2016-12-23 DIAGNOSIS — L4050 Arthropathic psoriasis, unspecified: Secondary | ICD-10-CM

## 2016-12-23 DIAGNOSIS — E785 Hyperlipidemia, unspecified: Secondary | ICD-10-CM | POA: Diagnosis not present

## 2016-12-23 DIAGNOSIS — D518 Other vitamin B12 deficiency anemias: Secondary | ICD-10-CM

## 2016-12-23 LAB — CBC WITH DIFFERENTIAL/PLATELET
BASOS ABS: 0.1 10*3/uL (ref 0.0–0.1)
BASOS PCT: 1 % (ref 0.0–3.0)
EOS PCT: 1.2 % (ref 0.0–5.0)
Eosinophils Absolute: 0.1 10*3/uL (ref 0.0–0.7)
HEMATOCRIT: 49.1 % (ref 39.0–52.0)
Hemoglobin: 16.3 g/dL (ref 13.0–17.0)
LYMPHS PCT: 24.2 % (ref 12.0–46.0)
Lymphs Abs: 1.6 10*3/uL (ref 0.7–4.0)
MCHC: 33.2 g/dL (ref 30.0–36.0)
MCV: 96.3 fl (ref 78.0–100.0)
MONOS PCT: 7.6 % (ref 3.0–12.0)
Monocytes Absolute: 0.5 10*3/uL (ref 0.1–1.0)
NEUTROS ABS: 4.3 10*3/uL (ref 1.4–7.7)
NEUTROS PCT: 66 % (ref 43.0–77.0)
PLATELETS: 242 10*3/uL (ref 150.0–400.0)
RBC: 5.09 Mil/uL (ref 4.22–5.81)
RDW: 13.2 % (ref 11.5–15.5)
WBC: 6.5 10*3/uL (ref 4.0–10.5)

## 2016-12-23 LAB — COMPREHENSIVE METABOLIC PANEL
ALT: 44 U/L (ref 0–53)
AST: 25 U/L (ref 0–37)
Albumin: 4.6 g/dL (ref 3.5–5.2)
Alkaline Phosphatase: 49 U/L (ref 39–117)
BILIRUBIN TOTAL: 0.8 mg/dL (ref 0.2–1.2)
BUN: 12 mg/dL (ref 6–23)
CHLORIDE: 106 meq/L (ref 96–112)
CO2: 21 mEq/L (ref 19–32)
Calcium: 9.5 mg/dL (ref 8.4–10.5)
Creatinine, Ser: 0.97 mg/dL (ref 0.40–1.50)
GFR: 82.25 mL/min (ref >60.00)
GLUCOSE: 178 mg/dL — AB (ref 70–99)
POTASSIUM: 4.2 meq/L (ref 3.5–5.1)
SODIUM: 138 meq/L (ref 135–145)
TOTAL PROTEIN: 7.5 g/dL (ref 6.0–8.3)

## 2016-12-23 LAB — LIPID PANEL
CHOLESTEROL: 178 mg/dL (ref 0–200)
HDL: 52.3 mg/dL (ref >39.00)
LDL Cholesterol: 91 mg/dL (ref 0–99)
NonHDL: 125.35
TRIGLYCERIDES: 171 mg/dL — AB (ref 0.0–149.0)
Total CHOL/HDL Ratio: 3
VLDL: 34.2 mg/dL (ref 0.0–40.0)

## 2016-12-23 LAB — SEDIMENTATION RATE: SED RATE: 26 mm/h — AB (ref 0–20)

## 2016-12-23 LAB — PSA: PSA: 0.37 ng/mL (ref 0.10–4.00)

## 2016-12-23 LAB — HEMOGLOBIN A1C: Hgb A1c MFr Bld: 8.5 % — ABNORMAL HIGH (ref 4.6–6.5)

## 2016-12-23 LAB — TSH: TSH: 1.28 u[IU]/mL (ref 0.35–4.50)

## 2016-12-23 LAB — VITAMIN B12: VITAMIN B 12: 263 pg/mL (ref 211–911)

## 2016-12-23 NOTE — Assessment & Plan Note (Signed)
DM: sees endocrinology, will check labs and fax them to Dr. Chalmers Cater: A1c, FLP HTN: Seems well-controlled on losartan, checking labs: CMP, CBC, TSH Psoriatic arthritis: Per rheumatology, we'll forward labs to Dr. Amil Amen Vitamin B 12 deficiency per history, checking labs Essential tremor: Hand shakiness likely essential tremor, no Parkinsonian features, rec observation. If sx severe let me know for potential treatment/referral RTC one year

## 2017-03-09 DIAGNOSIS — E7800 Pure hypercholesterolemia, unspecified: Secondary | ICD-10-CM | POA: Diagnosis not present

## 2017-03-09 DIAGNOSIS — E1165 Type 2 diabetes mellitus with hyperglycemia: Secondary | ICD-10-CM | POA: Diagnosis not present

## 2017-03-09 DIAGNOSIS — E291 Testicular hypofunction: Secondary | ICD-10-CM | POA: Diagnosis not present

## 2017-03-09 DIAGNOSIS — I10 Essential (primary) hypertension: Secondary | ICD-10-CM | POA: Diagnosis not present

## 2017-03-09 DIAGNOSIS — E1140 Type 2 diabetes mellitus with diabetic neuropathy, unspecified: Secondary | ICD-10-CM | POA: Diagnosis not present

## 2017-04-18 ENCOUNTER — Other Ambulatory Visit: Payer: Self-pay

## 2017-04-18 MED ORDER — LOSARTAN POTASSIUM 100 MG PO TABS: 100.0000 mg | ORAL_TABLET | Freq: Every day | ORAL | 2 refills | Status: DC

## 2017-04-27 DIAGNOSIS — E1165 Type 2 diabetes mellitus with hyperglycemia: Secondary | ICD-10-CM | POA: Diagnosis not present

## 2017-06-22 DIAGNOSIS — L4059 Other psoriatic arthropathy: Secondary | ICD-10-CM | POA: Diagnosis not present

## 2017-06-22 DIAGNOSIS — Z6832 Body mass index (BMI) 32.0-32.9, adult: Secondary | ICD-10-CM | POA: Diagnosis not present

## 2017-06-22 DIAGNOSIS — L409 Psoriasis, unspecified: Secondary | ICD-10-CM | POA: Diagnosis not present

## 2017-06-22 DIAGNOSIS — E669 Obesity, unspecified: Secondary | ICD-10-CM | POA: Diagnosis not present

## 2017-06-29 DIAGNOSIS — I10 Essential (primary) hypertension: Secondary | ICD-10-CM | POA: Diagnosis not present

## 2017-06-29 DIAGNOSIS — E291 Testicular hypofunction: Secondary | ICD-10-CM | POA: Diagnosis not present

## 2017-06-29 DIAGNOSIS — E1165 Type 2 diabetes mellitus with hyperglycemia: Secondary | ICD-10-CM | POA: Diagnosis not present

## 2017-06-29 DIAGNOSIS — E7800 Pure hypercholesterolemia, unspecified: Secondary | ICD-10-CM | POA: Diagnosis not present

## 2017-06-29 DIAGNOSIS — E1140 Type 2 diabetes mellitus with diabetic neuropathy, unspecified: Secondary | ICD-10-CM | POA: Diagnosis not present

## 2017-10-17 ENCOUNTER — Other Ambulatory Visit: Payer: Self-pay

## 2017-10-17 MED ORDER — METFORMIN HCL 1000 MG PO TABS: 1000.0000 mg | ORAL_TABLET | Freq: Two times a day (BID) | ORAL | 0 refills | Status: DC

## 2017-11-16 ENCOUNTER — Other Ambulatory Visit: Payer: Self-pay | Admitting: Internal Medicine

## 2017-11-16 MED ORDER — METFORMIN HCL 1000 MG PO TABS: 1000.0000 mg | ORAL_TABLET | Freq: Two times a day (BID) | ORAL | 0 refills | Status: DC

## 2017-11-16 NOTE — Telephone Encounter (Signed)
Copied from [place] [id number]. Topic: Quick Communication - See Telephone Encounter >> Nov 16, 2017  9:39 AM Bea Graff, NT wrote: CRM for notification. See Telephone encounter for: 11/16/17. Myra with [facility] calling and states they sent the metFORMIN (GLUCOPHAGE) 1000 MG tablet to a wrong address and would like to request a refill so they may send to the pt. CB#: [id number] Ref#: [id number]

## 2017-11-16 NOTE — Telephone Encounter (Signed)
Rx sent 

## 2017-11-23 ENCOUNTER — Encounter: Payer: Self-pay | Admitting: Internal Medicine

## 2017-11-23 DIAGNOSIS — E7800 Pure hypercholesterolemia, unspecified: Secondary | ICD-10-CM | POA: Diagnosis not present

## 2017-11-23 DIAGNOSIS — E1140 Type 2 diabetes mellitus with diabetic neuropathy, unspecified: Secondary | ICD-10-CM | POA: Diagnosis not present

## 2017-11-23 DIAGNOSIS — E291 Testicular hypofunction: Secondary | ICD-10-CM | POA: Diagnosis not present

## 2017-11-23 DIAGNOSIS — I10 Essential (primary) hypertension: Secondary | ICD-10-CM | POA: Diagnosis not present

## 2017-11-23 DIAGNOSIS — E1165 Type 2 diabetes mellitus with hyperglycemia: Secondary | ICD-10-CM | POA: Diagnosis not present

## 2017-11-23 DIAGNOSIS — L4059 Other psoriatic arthropathy: Secondary | ICD-10-CM | POA: Diagnosis not present

## 2017-11-23 LAB — HEMOGLOBIN A1C: Hgb A1c MFr Bld: 7.2 — AB (ref 4.0–6.0)

## 2017-11-23 LAB — HM DIABETES FOOT EXAM

## 2017-12-14 DIAGNOSIS — L409 Psoriasis, unspecified: Secondary | ICD-10-CM | POA: Diagnosis not present

## 2017-12-14 DIAGNOSIS — L4059 Other psoriatic arthropathy: Secondary | ICD-10-CM | POA: Diagnosis not present

## 2017-12-14 DIAGNOSIS — E669 Obesity, unspecified: Secondary | ICD-10-CM | POA: Diagnosis not present

## 2017-12-14 DIAGNOSIS — Z6833 Body mass index (BMI) 33.0-33.9, adult: Secondary | ICD-10-CM | POA: Diagnosis not present

## 2017-12-28 ENCOUNTER — Encounter: Payer: Self-pay | Admitting: Internal Medicine

## 2017-12-28 ENCOUNTER — Ambulatory Visit (INDEPENDENT_AMBULATORY_CARE_PROVIDER_SITE_OTHER): Payer: Medicare Other | Admitting: Internal Medicine

## 2017-12-28 VITALS — BP 134/68 | HR 68 | Temp 98.0°F | Resp 16 | Ht 73.0 in | Wt 258.5 lb

## 2017-12-28 DIAGNOSIS — Z125 Encounter for screening for malignant neoplasm of prostate: Secondary | ICD-10-CM | POA: Diagnosis not present

## 2017-12-28 DIAGNOSIS — I10 Essential (primary) hypertension: Secondary | ICD-10-CM | POA: Diagnosis not present

## 2017-12-28 DIAGNOSIS — L408 Other psoriasis: Secondary | ICD-10-CM | POA: Diagnosis not present

## 2017-12-28 DIAGNOSIS — L4059 Other psoriatic arthropathy: Secondary | ICD-10-CM | POA: Diagnosis not present

## 2017-12-28 DIAGNOSIS — Z09 Encounter for follow-up examination after completed treatment for conditions other than malignant neoplasm: Secondary | ICD-10-CM

## 2017-12-28 DIAGNOSIS — E785 Hyperlipidemia, unspecified: Secondary | ICD-10-CM | POA: Diagnosis not present

## 2017-12-28 LAB — LIPID PANEL
CHOL/HDL RATIO: 4
Cholesterol: 163 mg/dL (ref 0–200)
HDL: 42.6 mg/dL (ref >39.00)
LDL CALC: 86 mg/dL (ref 0–99)
NonHDL: 119.91
TRIGLYCERIDES: 168 mg/dL — AB (ref 0.0–149.0)
VLDL: 33.6 mg/dL (ref 0.0–40.0)

## 2017-12-28 LAB — CBC WITH DIFFERENTIAL/PLATELET
BASOS ABS: 0.1 10*3/uL (ref 0.0–0.1)
BASOS PCT: 1 % (ref 0.0–3.0)
EOS ABS: 0.2 10*3/uL (ref 0.0–0.7)
Eosinophils Relative: 2.4 % (ref 0.0–5.0)
HEMATOCRIT: 44.1 % (ref 39.0–52.0)
HEMOGLOBIN: 15.1 g/dL (ref 13.0–17.0)
Lymphocytes Relative: 22.2 % (ref 12.0–46.0)
Lymphs Abs: 1.6 10*3/uL (ref 0.7–4.0)
MCHC: 34.3 g/dL (ref 30.0–36.0)
MCV: 93.1 fl (ref 78.0–100.0)
Monocytes Absolute: 0.5 10*3/uL (ref 0.1–1.0)
Monocytes Relative: 7.3 % (ref 3.0–12.0)
Neutro Abs: 4.9 10*3/uL (ref 1.4–7.7)
Neutrophils Relative %: 67.1 % (ref 43.0–77.0)
Platelets: 252 10*3/uL (ref 150.0–400.0)
RBC: 4.73 Mil/uL (ref 4.22–5.81)
RDW: 12.8 % (ref 11.5–15.5)
WBC: 7.3 10*3/uL (ref 4.0–10.5)

## 2017-12-28 LAB — COMPREHENSIVE METABOLIC PANEL
ALBUMIN: 4.4 g/dL (ref 3.5–5.2)
ALT: 24 U/L (ref 0–53)
AST: 18 U/L (ref 0–37)
Alkaline Phosphatase: 54 U/L (ref 39–117)
BILIRUBIN TOTAL: 0.4 mg/dL (ref 0.2–1.2)
BUN: 19 mg/dL (ref 6–23)
CALCIUM: 9.7 mg/dL (ref 8.4–10.5)
CHLORIDE: 106 meq/L (ref 96–112)
CO2: 24 mEq/L (ref 19–32)
CREATININE: 0.93 mg/dL (ref 0.40–1.50)
GFR: 86.08 mL/min (ref >60.00)
Glucose, Bld: 146 mg/dL — ABNORMAL HIGH (ref 70–99)
Potassium: 4.6 mEq/L (ref 3.5–5.1)
Sodium: 138 mEq/L (ref 135–145)
Total Protein: 7.4 g/dL (ref 6.0–8.3)

## 2017-12-28 LAB — PSA: PSA: 0.4 ng/mL (ref 0.10–4.00)

## 2017-12-28 LAB — SEDIMENTATION RATE: Sed Rate: 47 mm/hr — ABNORMAL HIGH (ref 0–20)

## 2017-12-28 MED ORDER — ZOSTER VAC RECOMB ADJUVANTED 50 MCG/0.5ML IM SUSR: 0.5000 mL | Freq: Once | INTRAMUSCULAR | 1 refills | Status: AC

## 2017-12-28 MED ORDER — LOSARTAN POTASSIUM 100 MG PO TABS: 100.0000 mg | ORAL_TABLET | Freq: Every day | ORAL | 3 refills | Status: DC

## 2017-12-28 NOTE — Progress Notes (Signed)
Subjective:    Patient ID: Samuel Meyer, male    DOB: 04/13/1950, 67 y.o.   MRN: [id number]  DOS:  12/28/2017 Type of visit - description : rov  Interval history: HTN: Good compliance with losartan, BP when he sees other doctors is very good. DM: Medications were changed by endocrinology, has gained weight. Psoriatic arthritis, request a sed rate to be sent to rheumatology Preventive care: Request a PSA.   Review of Systems  Denies chest pain no difficulty breathing No nausea, vomiting, diarrhea No dysuria, difficulty urinating.  Past Medical History:  Diagnosis Date  . Diabetes mellitus 2/09  . Hyperlipidemia   . Hypertension   . Kidney stone   . Psoriatic arthritis ([place])    [facility] started on methotrexate 03/12/09, switch to enbrel aprox 2/11, seeing Dr. Amil Amen  . S/P cardiac cath   . Skin cancer    remotely, 4th L finger, not melanoma  . Sleep apnea    no treatment, pt denies diagnosis  . Testosterone deficiency     Past Surgical History:  Procedure Laterality Date  . APPENDECTOMY  1993  . [place]   left  . SHOULDER SURGERY  2008  . TONSILLECTOMY  1960    Social History   Socioeconomic History  . Marital status: Married    Spouse name: Not on file  . Number of children: 1  . Years of education: Not on file  . Highest education level: Not on file  Occupational History  . Occupation: part time-works on  business development  Social Needs  . Financial resource strain: Not on file  . Food insecurity:    Worry: Not on file    Inability: Not on file  . Transportation needs:    Medical: Not on file    Non-medical: Not on file  Tobacco Use  . Smoking status: Former Smoker    Last attempt to quit: 04/12/1978    Years since quitting: 39.7  . Smokeless tobacco: Never Used  Substance and Sexual Activity  . Alcohol use: No  . Drug use: No  . Sexual activity: Not on file  Lifestyle  . Physical activity:    Days per week: Not on file    Minutes  per session: Not on file  . Stress: Not on file  Relationships  . Social connections:    Talks on phone: Not on file    Gets together: Not on file    Attends religious service: Not on file    Active member of club or organization: Not on file    Attends meetings of clubs or organizations: Not on file    Relationship status: Not on file  . Intimate partner violence:    Fear of current or ex partner: Not on file    Emotionally abused: Not on file    Physically abused: Not on file    Forced sexual activity: Not on file  Other Topics Concern  . Not on file  Social History Narrative   Household--pt and wife      Allergies as of 12/28/2017      Reactions   Januvia [sitagliptin Phosphate] Rash   Rash   Statins       Medication List        Accurate as of 12/28/17 11:59 PM. Always use your most recent med list.          aspirin 81 MG tablet Take 81 mg by mouth daily.   Glucose Blood  Disk Check blood sugar no more than twice daily.   insulin NPH-regular Human (70-30) 100 UNIT/ML injection Commonly known as:  NOVOLIN 70/30 Inject 60 Units into the skin 2 (two) times daily with a meal.   losartan 100 MG tablet Commonly known as:  COZAAR Take 1 tablet (100 mg total) by mouth daily.   metFORMIN 1000 MG tablet Commonly known as:  GLUCOPHAGE Take 1 tablet (1,000 mg total) by mouth 2 (two) times daily with a meal.   STELARA 45 MG/0.5ML Soln Generic drug:  Ustekinumab Inject into the skin.   VITAMIN D2 PO Take 1 tablet by mouth daily.   Zoster Vaccine Adjuvanted injection Commonly known as:  SHINGRIX Inject 0.5 mLs into the muscle once for 1 dose.          Objective:   Physical Exam BP 134/68 (BP Location: Right Arm, Patient Position: Sitting, Cuff Size: Normal)   Pulse 68   Temp 98 F (36.7 C) (Oral)   Resp 16   Ht 6\' 1"  (1.854 m)   Wt 258 lb 8 oz (117.3 kg)   SpO2 97%   BMI 34.10 kg/m   General: Well developed, NAD, see BMI.  Neck: No  thyromegaly    HEENT:  Normocephalic . Face symmetric, atraumatic Lungs:  CTA B Normal respiratory effort, no intercostal retractions, no accessory muscle use. Heart: RRR,  no murmur.  No pretibial edema bilaterally  Abdomen:  Not distended, soft, non-tender. No rebound or rigidity.   Skin: Exposed areas without rash. Not pale. Not jaundice Neurologic:  alert & oriented X3.  Speech normal, gait appropriate for age and unassisted Strength symmetric and appropriate for age.  Psych: Cognition and judgment appear intact.  Cooperative with normal attention span and concentration.  Behavior appropriate. No anxious or depressed appearing.     Assessment & Plan:   Assessment DM, per endocrinology, Dr Chalmers Cater  HTN Hyperlipidemia-- statins intolerant Psoriatic arthritis-- Dr Amil Amen Testosterone deficiency - pt reports is f/u by endo (as of 11-2015) Increase LFTs:  LFTs were okay until 2011 about the time was rx Enbrel. Hep B and C serology (-) 2012. [place] 2014: fatty liver  Iron, ferritin normal. Ceruloplasmin slt low 2014 ---> GI rx 24 hour urine copper test OSA-- no CPAP, no sx as of 11-2015 Polycythemia, mild H/o mild  b 12 def  Skin cancer, remotely, not melanoma  PLAN: DM: Recently Invokana was changed to insulin.  Follow-up by Dr. Michiel Sites HTN: Seems well controlled on losartan.  Check CMP, CBC. EKG: nsr Hyperlipidemia: Diet controlled, check a FLP. Psoriasis: sees to be doing well, check a sed rate  Social: Doing great[place]. RTC 1 year. (sooner if needed)

## 2017-12-28 NOTE — Patient Instructions (Signed)
GO TO THE LAB : Get the blood work     GO TO THE FRONT DESK Schedule your next appointment for a  Check up in 1 year    

## 2017-12-28 NOTE — Progress Notes (Signed)
Pre visit review using our clinic review tool, if applicable. No additional management support is needed unless otherwise documented below in the visit note. 

## 2017-12-28 NOTE — Assessment & Plan Note (Addendum)
-  Td  2010; Pneumonia shot 2014;  prevnar 2016; zostavax: done 2014; shingrex dicussed, prescription printed. -[facility]--08/2012--Dr Pyrtle--adenomatous polyps; cscope again 2017: next 5 years per letter   -Prostate cancer screening, DRE- PSA wnl  2018.  Request a PSA, will do

## 2017-12-29 NOTE — Assessment & Plan Note (Addendum)
DM: Recently Invokana was changed to insulin.  Follow-up by Dr. Michiel Sites HTN: Seems well controlled on losartan.  Check CMP, CBC. EKG: nsr Hyperlipidemia: Diet controlled, check a FLP. Psoriasis: sees to be doing well, check a sed rate  Social: Doing great, [place]. RTC 1 year. (sooner if needed)

## 2018-01-27 ENCOUNTER — Encounter: Payer: Self-pay | Admitting: Internal Medicine

## 2018-03-14 ENCOUNTER — Encounter: Payer: Self-pay | Admitting: Internal Medicine

## 2018-03-15 ENCOUNTER — Other Ambulatory Visit: Payer: Self-pay | Admitting: Internal Medicine

## 2018-03-15 MED ORDER — PROPRANOLOL HCL 20 MG PO TABS: 20.0000 mg | ORAL_TABLET | Freq: Two times a day (BID) | ORAL | 1 refills | Status: DC

## 2018-04-06 ENCOUNTER — Other Ambulatory Visit: Payer: Self-pay | Admitting: Internal Medicine

## 2018-04-09 ENCOUNTER — Encounter: Payer: Self-pay | Admitting: Internal Medicine

## 2018-04-11 MED ORDER — PROPRANOLOL HCL 20 MG PO TABS: 20.0000 mg | ORAL_TABLET | Freq: Two times a day (BID) | ORAL | 1 refills | Status: DC

## 2018-04-11 NOTE — Addendum Note (Signed)
Addended byDamita Dunnings D on: 04/11/2018 09:09 AM   Modules accepted: Orders

## 2018-05-16 ENCOUNTER — Encounter: Payer: Self-pay | Admitting: Internal Medicine

## 2018-05-16 MED ORDER — LOSARTAN POTASSIUM 100 MG PO TABS: 100.0000 mg | ORAL_TABLET | Freq: Every day | ORAL | 3 refills | Status: DC

## 2018-05-16 MED ORDER — METFORMIN HCL 1000 MG PO TABS: 1000.0000 mg | ORAL_TABLET | Freq: Two times a day (BID) | ORAL | 3 refills | Status: DC

## 2018-05-31 ENCOUNTER — Encounter: Payer: Self-pay | Admitting: Internal Medicine

## 2018-05-31 MED ORDER — PROPRANOLOL HCL 40 MG PO TABS: 40.0000 mg | ORAL_TABLET | Freq: Two times a day (BID) | ORAL | 5 refills | Status: DC

## 2018-05-31 NOTE — Telephone Encounter (Signed)
See message, Inderal 40 mg 1 p.o. twice daily #60 and 5 refills.  Received: Today  Message Contents  Colon Branch, MD  Damita Dunnings, [place]

## 2018-06-15 ENCOUNTER — Encounter: Payer: Self-pay | Admitting: Internal Medicine

## 2018-08-02 DIAGNOSIS — I10 Essential (primary) hypertension: Secondary | ICD-10-CM | POA: Diagnosis not present

## 2018-08-02 DIAGNOSIS — E1140 Type 2 diabetes mellitus with diabetic neuropathy, unspecified: Secondary | ICD-10-CM | POA: Diagnosis not present

## 2018-08-02 DIAGNOSIS — E291 Testicular hypofunction: Secondary | ICD-10-CM | POA: Diagnosis not present

## 2018-08-02 DIAGNOSIS — E1165 Type 2 diabetes mellitus with hyperglycemia: Secondary | ICD-10-CM | POA: Diagnosis not present

## 2018-08-02 DIAGNOSIS — L4059 Other psoriatic arthropathy: Secondary | ICD-10-CM | POA: Diagnosis not present

## 2018-08-02 DIAGNOSIS — E7800 Pure hypercholesterolemia, unspecified: Secondary | ICD-10-CM | POA: Diagnosis not present

## 2018-08-02 DIAGNOSIS — L409 Psoriasis, unspecified: Secondary | ICD-10-CM | POA: Diagnosis not present

## 2018-08-22 DIAGNOSIS — E1165 Type 2 diabetes mellitus with hyperglycemia: Secondary | ICD-10-CM | POA: Diagnosis not present

## 2018-08-22 DIAGNOSIS — L4059 Other psoriatic arthropathy: Secondary | ICD-10-CM | POA: Diagnosis not present

## 2018-08-22 DIAGNOSIS — E7800 Pure hypercholesterolemia, unspecified: Secondary | ICD-10-CM | POA: Diagnosis not present

## 2018-08-22 LAB — BASIC METABOLIC PANEL
BUN: 16 (ref 4–21)
Creatinine: 1 (ref 0.6–1.3)
Glucose: 200
Potassium: 5.3 (ref 3.4–5.3)
Sodium: 137 (ref 137–147)

## 2018-08-22 LAB — HEPATIC FUNCTION PANEL
ALT: 31 (ref 10–40)
AST: 21 (ref 14–40)
Alkaline Phosphatase: 64 (ref 25–125)

## 2018-08-22 LAB — LIPID PANEL
Cholesterol: 199 (ref 0–200)
HDL: 43 (ref 35–70)
LDL Cholesterol: 112
Triglycerides: 218 — AB (ref 40–160)

## 2018-08-22 LAB — HEMOGLOBIN A1C: Hemoglobin A1C: 7.7

## 2018-11-14 ENCOUNTER — Encounter: Payer: Self-pay | Admitting: Internal Medicine

## 2018-11-16 DIAGNOSIS — M1711 Unilateral primary osteoarthritis, right knee: Secondary | ICD-10-CM | POA: Diagnosis not present

## 2018-11-16 DIAGNOSIS — M25561 Pain in right knee: Secondary | ICD-10-CM | POA: Diagnosis not present

## 2018-12-06 DIAGNOSIS — M7121 Synovial cyst of popliteal space [Baker], right knee: Secondary | ICD-10-CM | POA: Diagnosis not present

## 2018-12-06 DIAGNOSIS — M25561 Pain in right knee: Secondary | ICD-10-CM | POA: Diagnosis not present

## 2018-12-06 DIAGNOSIS — S8991XA Unspecified injury of right lower leg, initial encounter: Secondary | ICD-10-CM | POA: Diagnosis not present

## 2018-12-06 DIAGNOSIS — M1711 Unilateral primary osteoarthritis, right knee: Secondary | ICD-10-CM | POA: Diagnosis not present

## 2019-01-19 ENCOUNTER — Encounter: Payer: Self-pay | Admitting: Internal Medicine

## 2019-01-19 MED ORDER — PROPRANOLOL HCL 40 MG PO TABS: 40.0000 mg | ORAL_TABLET | Freq: Two times a day (BID) | ORAL | 0 refills | Status: DC

## 2019-01-25 ENCOUNTER — Encounter: Payer: Self-pay | Admitting: Internal Medicine

## 2019-01-29 DIAGNOSIS — Z23 Encounter for immunization: Secondary | ICD-10-CM | POA: Diagnosis not present

## 2019-02-02 ENCOUNTER — Encounter: Payer: Self-pay | Admitting: Internal Medicine

## 2019-02-02 NOTE — Telephone Encounter (Signed)
Sherri- can you contact Pt to set up virtual/telephone visit w/ Dr. Larose Kells at his convenience please?

## 2019-02-05 ENCOUNTER — Ambulatory Visit (INDEPENDENT_AMBULATORY_CARE_PROVIDER_SITE_OTHER): Payer: Medicare Other | Admitting: Internal Medicine

## 2019-02-05 ENCOUNTER — Encounter: Payer: Self-pay | Admitting: Internal Medicine

## 2019-02-05 ENCOUNTER — Other Ambulatory Visit: Payer: Self-pay

## 2019-02-05 DIAGNOSIS — E785 Hyperlipidemia, unspecified: Secondary | ICD-10-CM | POA: Diagnosis not present

## 2019-02-05 DIAGNOSIS — I10 Essential (primary) hypertension: Secondary | ICD-10-CM | POA: Diagnosis not present

## 2019-02-05 DIAGNOSIS — E875 Hyperkalemia: Secondary | ICD-10-CM | POA: Diagnosis not present

## 2019-02-05 DIAGNOSIS — E1169 Type 2 diabetes mellitus with other specified complication: Secondary | ICD-10-CM

## 2019-02-05 MED ORDER — SHINGRIX 50 MCG/0.5ML IM SUSR: 0.5000 mL | Freq: Once | INTRAMUSCULAR | 0 refills | Status: AC

## 2019-02-05 MED ORDER — LOSARTAN POTASSIUM 100 MG PO TABS: 100.0000 mg | ORAL_TABLET | Freq: Every day | ORAL | 3 refills | Status: DC

## 2019-02-05 MED ORDER — PROPRANOLOL HCL 40 MG PO TABS: 40.0000 mg | ORAL_TABLET | Freq: Two times a day (BID) | ORAL | 3 refills | Status: DC

## 2019-02-05 NOTE — Progress Notes (Signed)
Subjective:    Patient ID: Samuel Meyer, male    DOB: 03-06-1951, 68 y.o.   MRN: [id number]  DOS:  02/05/2019 Type of visit - description:Virtual Visit via Video Note  I connected with@   by a video enabled telemedicine application and verified that I am speaking with the correct person using two identifiers.   THIS ENCOUNTER IS A VIRTUAL VISIT DUE TO COVID-19 - PATIENT WAS NOT SEEN IN THE OFFICE. PATIENT HAS CONSENTED TO VIRTUAL VISIT / TELEMEDICINE VISIT   Location of patient: home  Location of provider: office  I discussed the limitations of evaluation and management by telemedicine and the availability of in person appointments. The patient expressed understanding and agreed to proceed.  History of Present Illness: Routine office visit HTN: Good compliance with medication, normal ambulatory BPs DM: Per Dr. Chalmers Cater Psoriatic arthritis: On Enbrel. OSA: Not using CPAP   Review of Systems Denies fever chills No chest pain or difficulty breathing No nausea, vomiting, diarrhea  Past Medical History:  Diagnosis Date  . Diabetes mellitus 2/09  . Hyperlipidemia   . Hypertension   . Kidney stone   . Psoriatic arthritis ([place])    [facility] started on methotrexate 03/12/09, switch to enbrel aprox 2/11, seeing Dr. Amil Amen  . S/P cardiac cath   . Skin cancer    remotely, 4th L finger, not melanoma  . Sleep apnea    no treatment, pt denies diagnosis  . Testosterone deficiency     Past Surgical History:  Procedure Laterality Date  . APPENDECTOMY  1993  . [place]   left  . SHOULDER SURGERY  2008  . TONSILLECTOMY  1960    Social History   Socioeconomic History  . Marital status: Married    Spouse name: Not on file  . Number of children: 1  . Years of education: Not on file  . Highest education level: Not on file  Occupational History  . Occupation: RETIRED-works on  business development  Social Needs  . Financial resource strain: Not on file  . Food  insecurity    Worry: Not on file    Inability: Not on file  . Transportation needs    Medical: Not on file    Non-medical: Not on file  Tobacco Use  . Smoking status: Former Smoker    Quit date: 04/12/1978    Years since quitting: 40.8  . Smokeless tobacco: Never Used  Substance and Sexual Activity  . Alcohol use: No  . Drug use: No  . Sexual activity: Not on file  Lifestyle  . Physical activity    Days per week: Not on file    Minutes per session: Not on file  . Stress: Not on file  Relationships  . Social Herbalist on phone: Not on file    Gets together: Not on file    Attends religious service: Not on file    Active member of club or organization: Not on file    Attends meetings of clubs or organizations: Not on file    Relationship status: Not on file  . Intimate partner violence    Fear of current or ex partner: Not on file    Emotionally abused: Not on file    Physically abused: Not on file    Forced sexual activity: Not on file  Other Topics Concern  . Not on file  Social History Narrative   Household--pt and wife   As of 01-2019  living in [place] as of 02/05/2019      Reactions   Januvia [sitagliptin Phosphate] Rash   Rash   Statins       Medication List       Accurate as of February 05, 2019 11:59 PM. If you have any questions, ask your nurse or doctor.        STOP taking these medications   Stelara 45 MG/0.5ML Soln Generic drug: Ustekinumab Stopped by: Kathlene November, MD   VITAMIN D2 PO Stopped by: Kathlene November, MD     TAKE these medications   aspirin 81 MG tablet Take 81 mg by mouth daily.   Glucose Blood Disk Commonly known as: Psychologist, forensic 2 Test Check blood sugar no more than twice daily.   insulin NPH-regular Human (70-30) 100 UNIT/ML injection Inject 60 Units into the skin 2 (two) times daily with a meal.   losartan 100 MG tablet Commonly known as: COZAAR Take 1 tablet (100 mg total) by mouth daily.    metFORMIN 1000 MG tablet Commonly known as: GLUCOPHAGE Take 1 tablet (1,000 mg total) by mouth 2 (two) times daily with a meal.   propranolol 40 MG tablet Commonly known as: INDERAL Take 1 tablet (40 mg total) by mouth 2 (two) times daily.   Shingrix injection Generic drug: Zoster Vaccine Adjuvanted Inject 0.5 mLs into the muscle once for 1 dose. Started by: Kathlene November, MD           Objective:   Physical Exam There were no vitals taken for this visit. This is a virtual video visit, he is alert oriented x3, in no apparent distress.    Assessment      Assessment DM, per endocrinology, Dr Chalmers Cater  HTN Hyperlipidemia-- statins intolerant Psoriatic arthritis-- Dr Amil Amen Testosterone deficiency - pt reports is f/u by endo (as of 11-2015) Increase LFTs:  LFTs were okay until 2011 about the time was rx Enbrel. Hep B and C serology (-) 2012. [place] 2014: fatty liver  Iron, ferritin normal. Ceruloplasmin slt low 2014 ---> GI rx 24 hour urine copper test OSA-- no CPAP, no sx as of 11-2015 Polycythemia, mild H/o mild  b 12 def  Skin cancer, remotely, not melanoma  PLAN: Labs reviewed 08/22/2018: Hemoglobin 17.3, platelets 261, WBC 6.9 Calcium 10.2 normal, AST ALT normal.  Creatinine 1.04.  Potassium 5.1 Another set of labs from the same day 08/22/2018: Normal LFTs, potassium 5.3, calcium 10.4 (slightly elevated), creatinine 1.0. Total cholesterol 199, LDL 112. A1c 7.7 DM: Per endocrinology HTN: Currently on losartan.  Ambulatory BPs 120/80 Hyperkalemia: Low potassium diet recommended.  See message  Hyperlipidemia: LDL not at goal, statin intolerant OSA, not on CPAP.  Asymptomatic Social: He is fully retired, less stress, moved to [place], plans to travel throughout the country. Preventive care:  Doing well w/ diet, lost 20 pounds per pt  Had a flu shot Recommend Shingrix, will send it to [facility] in [facility]. Due for a CPX, he is now living out of this  area, he will come back in June 2021, he will call and schedule.  Today, I spent more than 25   min with the patient: >50% of the time counseling regards labs done by other providers, reviewing the chart, reviewing the need for shingles on a low potassium diet.   I discussed the assessment and treatment plan with the patient. The patient was provided an opportunity to ask questions  and all were answered. The patient agreed with the plan and demonstrated an understanding of the instructions.   The patient was advised to call back or seek an in-person evaluation if the symptoms worsen or if the condition fails to improve as anticipated.

## 2019-02-06 ENCOUNTER — Encounter: Payer: Self-pay | Admitting: Internal Medicine

## 2019-02-06 NOTE — Assessment & Plan Note (Signed)
Labs reviewed 08/22/2018: Hemoglobin 17.3, platelets 261, WBC 6.9 Calcium 10.2 normal, AST ALT normal.  Creatinine 1.04.  Potassium 5.1 Another set of labs from the same day 08/22/2018: Normal LFTs, potassium 5.3, calcium 10.4 (slightly elevated), creatinine 1.0. Total cholesterol 199, LDL 112. A1c 7.7 DM: Per endocrinology HTN: Currently on losartan.  Ambulatory BPs 120/80 Hyperkalemia: Low potassium diet recommended.  See message  Hyperlipidemia: LDL not at goal, statin intolerant OSA, not on CPAP.  Asymptomatic Social: He is fully retired, less stress, moved to [place], plans to travel throughout the country. Preventive care:  Doing well w/ diet, lost 20 pounds per pt  Had a flu shot Recommend Shingrix, will send it to [facility] in [facility]. Due for a CPX, he is now living out of this area, he will come back in June 2021, he will call and schedule.

## 2019-02-26 DIAGNOSIS — E291 Testicular hypofunction: Secondary | ICD-10-CM | POA: Diagnosis not present

## 2019-02-26 DIAGNOSIS — E1165 Type 2 diabetes mellitus with hyperglycemia: Secondary | ICD-10-CM | POA: Diagnosis not present

## 2019-02-26 DIAGNOSIS — E1140 Type 2 diabetes mellitus with diabetic neuropathy, unspecified: Secondary | ICD-10-CM | POA: Diagnosis not present

## 2019-02-26 DIAGNOSIS — I10 Essential (primary) hypertension: Secondary | ICD-10-CM | POA: Diagnosis not present

## 2019-02-26 DIAGNOSIS — E7800 Pure hypercholesterolemia, unspecified: Secondary | ICD-10-CM | POA: Diagnosis not present

## 2019-02-27 DIAGNOSIS — L409 Psoriasis, unspecified: Secondary | ICD-10-CM | POA: Diagnosis not present

## 2019-02-27 DIAGNOSIS — L4059 Other psoriatic arthropathy: Secondary | ICD-10-CM | POA: Diagnosis not present

## 2019-05-25 ENCOUNTER — Other Ambulatory Visit: Payer: Self-pay | Admitting: Internal Medicine

## 2019-06-29 ENCOUNTER — Encounter: Payer: Self-pay | Admitting: Internal Medicine

## 2019-07-17 ENCOUNTER — Telehealth: Payer: Self-pay | Admitting: Internal Medicine

## 2019-07-17 NOTE — Telephone Encounter (Signed)
[facility] is requesting Most recent lab results   FAX [phone]

## 2019-07-17 NOTE — Telephone Encounter (Signed)
LMOM for Beekman's nurse/asst- informed last labs in our office was in 2019. Informed to call if she has questions/concerns and that we have been trying to contact Pt for a visit.

## 2019-09-21 LAB — CBC AND DIFFERENTIAL
HCT: 52 (ref 41–53)
Hemoglobin: 17.3 (ref 13.5–17.5)
Neutrophils Absolute: 4
Platelets: 241 (ref 150–399)
WBC: 7.3

## 2019-09-21 LAB — CBC: RBC: 5.48 — AB (ref 3.87–5.11)

## 2019-11-24 ENCOUNTER — Other Ambulatory Visit: Payer: Self-pay | Admitting: Internal Medicine

## 2019-12-07 ENCOUNTER — Encounter: Payer: Medicare Other | Admitting: Internal Medicine

## 2019-12-07 LAB — LIPID PANEL
Cholesterol: 176 (ref 0–200)
HDL: 45 (ref 35–70)
LDL Cholesterol: 103
LDl/HDL Ratio: 2.3
Triglycerides: 140 (ref 40–160)

## 2019-12-07 LAB — BASIC METABOLIC PANEL
BUN: 14 (ref 4–21)
CO2: 26 — AB (ref 13–22)
Chloride: 107 (ref 99–108)
Creatinine: 1 (ref 0.6–1.3)
Glucose: 148
Potassium: 4.7 (ref 3.4–5.3)
Sodium: 143 (ref 137–147)

## 2019-12-07 LAB — HEPATIC FUNCTION PANEL
ALT: 21 (ref 10–40)
AST: 15 (ref 14–40)
Alkaline Phosphatase: 62 (ref 25–125)

## 2019-12-07 LAB — COMPREHENSIVE METABOLIC PANEL
Calcium: 8.9 (ref 8.7–10.7)
GFR calc Af Amer: 95.24
GFR calc non Af Amer: 78.71

## 2019-12-07 LAB — MICROALBUMIN, URINE: Microalb, Ur: 12.1

## 2019-12-07 LAB — HEMOGLOBIN A1C: Hemoglobin A1C: 6.7

## 2019-12-12 ENCOUNTER — Encounter: Payer: Self-pay | Admitting: Internal Medicine

## 2019-12-21 ENCOUNTER — Encounter: Payer: Self-pay | Admitting: Internal Medicine

## 2020-01-02 ENCOUNTER — Other Ambulatory Visit: Payer: Self-pay

## 2020-01-02 ENCOUNTER — Encounter: Payer: Self-pay | Admitting: Internal Medicine

## 2020-01-02 ENCOUNTER — Telehealth (INDEPENDENT_AMBULATORY_CARE_PROVIDER_SITE_OTHER): Payer: Medicare Other | Admitting: Internal Medicine

## 2020-01-02 VITALS — BP 116/82 | Ht 73.0 in | Wt 217.1 lb

## 2020-01-02 DIAGNOSIS — E1169 Type 2 diabetes mellitus with other specified complication: Secondary | ICD-10-CM

## 2020-01-02 DIAGNOSIS — I10 Essential (primary) hypertension: Secondary | ICD-10-CM | POA: Diagnosis not present

## 2020-01-02 NOTE — Progress Notes (Signed)
Pre visit review using our clinic review tool, if applicable. No additional management support is needed unless otherwise documented below in the visit note. 

## 2020-01-02 NOTE — Progress Notes (Signed)
Subjective:    Patient ID: Samuel Meyer, male    DOB: 1950-06-17, 69 y.o.   MRN: [id number]  DOS:  01/02/2020 Type of visit - description: Virtual Visit via Video Note  I connected with the above patient  by a video enabled telemedicine application and verified that I am speaking with the correct person using two identifiers.   THIS ENCOUNTER IS A VIRTUAL VISIT DUE TO COVID-19 - PATIENT WAS NOT SEEN IN THE OFFICE. PATIENT HAS CONSENTED TO VIRTUAL VISIT / TELEMEDICINE VISIT   Location of patient: home  Location of provider: office  Persons participating in the virtual visit: patient, provider   I discussed the limitations of evaluation and management by telemedicine and the availability of in person appointments. The patient expressed understanding and agreed to proceed.  Follow-up Doing great. Reports no complaints. Has lost weight following healthy diet.    Review of Systems See above   Past Medical History:  Diagnosis Date  . Diabetes mellitus 2/09  . Hyperlipidemia   . Hypertension   . Kidney stone   . Psoriatic arthritis ([place])    [facility] started on methotrexate 03/12/09, switch to enbrel aprox 2/11, seeing Dr. Amil Amen  . S/P cardiac cath   . Skin cancer    remotely, 4th L finger, not melanoma  . Sleep apnea    no treatment, pt denies diagnosis  . Testosterone deficiency     Past Surgical History:  Procedure Laterality Date  . APPENDECTOMY  1993  . [place]   left  . SHOULDER SURGERY  2008  . TONSILLECTOMY  1960    Allergies as of 01/02/2020      Reactions   Januvia [sitagliptin Phosphate] Rash   Rash   Statins       Medication List       Accurate as of January 02, 2020 11:59 PM. If you have any questions, ask your nurse or doctor.        aspirin 81 MG tablet Take 81 mg by mouth daily.   ENBREL [place] Inject into the skin.   Glucose Blood Disk Commonly known as: Psychologist, forensic 2 Test Check blood sugar no more than twice daily.    insulin NPH-regular Human (70-30) 100 UNIT/ML injection Inject 60 Units into the skin 2 (two) times daily with a meal.   losartan 100 MG tablet Commonly known as: COZAAR Take 1 tablet (100 mg total) by mouth daily.   metFORMIN 1000 MG tablet Commonly known as: GLUCOPHAGE Take 1 tablet (1,000 mg total) by mouth 2 (two) times daily with a meal.   propranolol 40 MG tablet Commonly known as: INDERAL Take 1 tablet (40 mg total) by mouth 2 (two) times daily.          Objective:   Physical Exam BP 116/82   Ht 6\' 1"  (1.854 m)   Wt 217 lb 1 oz (98.5 kg)   BMI 28.64 kg/m  This is a virtual video visit, he looks alert oriented x3, in no distress.    Assessment     Assessment DM, per endocrinology, Dr Chalmers Cater  HTN Hyperlipidemia-- statins intolerant Psoriatic arthritis-- Dr Amil Amen Testosterone deficiency - pt reports is f/u by endo (as of 11-2015) Increase LFTs:  LFTs were okay until 2011 about the time was rx Enbrel. Hep B and C serology (-) 2012. [place] 2014: fatty liver  Iron, ferritin normal. Ceruloplasmin slt low 2014 ---> GI rx 24 hour urine copper test OSA-- no CPAP, no  sx as of 11-2015 Polycythemia, mild H/o mild  b 12 def  Skin cancer, remotely, not melanoma  PLAN:  DM: Per Dr. Chalmers Cater, had labs done 11-2019, her office was called and labs reports obtained: Potassium 4.7, creatinine 1.0, AST, ALT normal. Total cholesterol 176, LDL 103 [id number] on losartan, ambulatory BPs great, today 116/82. Doing great with diet. Psoriatic arthritis: On Enbrel, follow-up by rheumatology, they check labs regularly. Preventive care: Reports had a Shingrix 2020 x1.  We will send the message, he will need a booster. Had a Covid vaccine booster along with flu shot recently. Other issues: Spending few months in [place] and other months in the [place] He is thinking about getting new doctors in [place], I think that will be beneficial to him. Otherwise we agreed to see him in person  next summer.  We will need a PSA at the time.       I discussed the assessment and treatment plan with the patient. The patient was provided an opportunity to ask questions and all were answered. The patient agreed with the plan and demonstrated an understanding of the instructions.   The patient was advised to call back or seek an in-person evaluation if the symptoms worsen or if the condition fails to improve as anticipated.

## 2020-01-03 NOTE — Assessment & Plan Note (Signed)
DM: Per Dr. Chalmers Cater, had labs done 11-2019, her office was called and labs reports obtained: Potassium 4.7, creatinine 1.0, AST, ALT normal. Total cholesterol 176, LDL 103 [id number] on losartan, ambulatory BPs great, today 116/82. Doing great with diet. Psoriatic arthritis: On Enbrel, follow-up by rheumatology, they check labs regularly. Preventive care: Reports had a Shingrix 2020 x1.  We will send the message, he will need a booster. Had a Covid vaccine booster along with flu shot recently. Other issues: Spending few months in [place] and other months in the [place] Kitchen He is thinking about getting new doctors in [place], I think that will be beneficial to him. Otherwise we agreed to see him in person next summer.  We will need a PSA at the time.

## 2020-01-07 ENCOUNTER — Encounter: Payer: Self-pay | Admitting: Internal Medicine

## 2020-02-10 ENCOUNTER — Encounter: Payer: Self-pay | Admitting: Internal Medicine

## 2020-02-11 ENCOUNTER — Other Ambulatory Visit: Payer: Self-pay | Admitting: Internal Medicine

## 2020-02-11 MED ORDER — AZITHROMYCIN 250 MG PO TABS: 500.0000 mg | ORAL_TABLET | Freq: Every day | ORAL | 0 refills | Status: AC

## 2020-02-11 MED ORDER — AZITHROMYCIN 250 MG PO TABS: 500.0000 mg | ORAL_TABLET | Freq: Every day | ORAL | 0 refills | Status: DC

## 2020-02-28 ENCOUNTER — Other Ambulatory Visit: Payer: Self-pay | Admitting: Internal Medicine

## 2020-02-29 ENCOUNTER — Encounter: Payer: Self-pay | Admitting: Internal Medicine

## 2020-02-29 MED ORDER — PROPRANOLOL HCL 40 MG PO TABS: 40.0000 mg | ORAL_TABLET | Freq: Two times a day (BID) | ORAL | 3 refills | Status: AC

## 2020-02-29 MED ORDER — LOSARTAN POTASSIUM 100 MG PO TABS: 100.0000 mg | ORAL_TABLET | Freq: Every day | ORAL | 3 refills | Status: AC

## 2020-04-01 ENCOUNTER — Encounter: Payer: Self-pay | Admitting: Internal Medicine

## 2020-08-06 ENCOUNTER — Encounter: Payer: Self-pay | Admitting: Internal Medicine

## 2020-11-30 ENCOUNTER — Other Ambulatory Visit: Payer: Self-pay | Admitting: Internal Medicine

## 2020-11-30 ENCOUNTER — Encounter: Payer: Self-pay | Admitting: Internal Medicine

## 2020-12-02 ENCOUNTER — Ambulatory Visit: Payer: Medicare Other | Admitting: Internal Medicine

## 2021-03-02 ENCOUNTER — Other Ambulatory Visit: Payer: Self-pay | Admitting: Internal Medicine

## 2022-12-03 ENCOUNTER — Other Ambulatory Visit (HOSPITAL_COMMUNITY): Payer: Self-pay | Admitting: Endocrinology

## 2022-12-03 DIAGNOSIS — E1165 Type 2 diabetes mellitus with hyperglycemia: Secondary | ICD-10-CM

## 2023-01-06 ENCOUNTER — Other Ambulatory Visit (HOSPITAL_COMMUNITY): Payer: Self-pay | Admitting: Endocrinology

## 2023-01-06 DIAGNOSIS — E1165 Type 2 diabetes mellitus with hyperglycemia: Secondary | ICD-10-CM
